# Patient Record
Sex: Female | Born: 1970 | Race: White | Hispanic: No | Smoking: Never smoker
Health system: Southern US, Community
[De-identification: ages and names within clinical notes are randomized; demographics above are authoritative.]

## PROBLEM LIST (undated history)

## (undated) DIAGNOSIS — R112 Nausea with vomiting, unspecified: Secondary | ICD-10-CM

## (undated) DIAGNOSIS — F32A Depression, unspecified: Secondary | ICD-10-CM

## (undated) DIAGNOSIS — Z9889 Other specified postprocedural states: Secondary | ICD-10-CM

## (undated) DIAGNOSIS — D649 Anemia, unspecified: Secondary | ICD-10-CM

## (undated) DIAGNOSIS — F419 Anxiety disorder, unspecified: Secondary | ICD-10-CM

## (undated) DIAGNOSIS — F329 Major depressive disorder, single episode, unspecified: Secondary | ICD-10-CM

## (undated) DIAGNOSIS — C9210 Chronic myeloid leukemia, BCR/ABL-positive, not having achieved remission: Secondary | ICD-10-CM

## (undated) HISTORY — PX: BREAST SURGERY: SHX581

## (undated) HISTORY — PX: DILATION AND CURETTAGE OF UTERUS: SHX78

## (undated) HISTORY — PX: CHOLECYSTECTOMY: SHX55

---

## 2000-05-15 ENCOUNTER — Encounter (INDEPENDENT_AMBULATORY_CARE_PROVIDER_SITE_OTHER): Payer: Self-pay | Admitting: Specialist

## 2000-05-15 ENCOUNTER — Other Ambulatory Visit: Admission: RE | Admit: 2000-05-15 | Discharge: 2000-05-15 | Payer: Self-pay | Admitting: Plastic Surgery

## 2007-08-15 ENCOUNTER — Ambulatory Visit: Payer: Self-pay | Admitting: Hematology & Oncology

## 2007-08-19 LAB — CHCC SMEAR

## 2007-08-19 LAB — CBC & DIFF AND RETIC
BASO%: 0.4 % (ref 0.0–2.0)
IRF: 0.35 — ABNORMAL HIGH (ref 0.130–0.330)
MCHC: 34.4 g/dL (ref 32.0–36.0)
MONO#: 0.5 10*3/uL (ref 0.1–0.9)
RBC: 4.16 10*6/uL (ref 3.70–5.32)
Retic %: 1.3 % (ref 0.4–2.3)
WBC: 43.2 10*3/uL — ABNORMAL HIGH (ref 3.9–10.0)
lymph#: 3.9 10*3/uL — ABNORMAL HIGH (ref 0.9–3.3)

## 2007-08-19 LAB — IVY BLEEDING TIME: Bleeding Time: 12 Minutes — ABNORMAL HIGH (ref 2.0–8.0)

## 2007-08-23 ENCOUNTER — Encounter: Payer: Self-pay | Admitting: Hematology & Oncology

## 2007-08-23 ENCOUNTER — Ambulatory Visit (HOSPITAL_COMMUNITY): Admission: RE | Admit: 2007-08-23 | Discharge: 2007-08-23 | Payer: Self-pay | Admitting: Hematology & Oncology

## 2007-09-02 LAB — VON WILLEBRAND PANEL: Ristocetin-Cofactor: 51 % (ref 50–150)

## 2007-09-02 LAB — TRANSFERRIN RECEPTOR, SOLUABLE: Transferrin Receptor, Soluble: 17.1 nmol/L

## 2007-09-03 LAB — TECHNOLOGIST REVIEW

## 2007-09-03 LAB — CBC WITH DIFFERENTIAL/PLATELET
BASO%: 0.3 % (ref 0.0–2.0)
Basophils Absolute: 0.1 10e3/uL (ref 0.0–0.1)
EOS%: 0.7 % (ref 0.0–7.0)
Eosinophils Absolute: 0.3 10e3/uL (ref 0.0–0.5)
HCT: 35.4 % (ref 34.8–46.6)
HGB: 12.2 g/dL (ref 11.6–15.9)
LYMPH%: 11.7 % — ABNORMAL LOW (ref 14.0–48.0)
MCH: 30.7 pg (ref 26.0–34.0)
MCHC: 34.5 g/dL (ref 32.0–36.0)
MCV: 88.9 fL (ref 81.0–101.0)
MONO#: 0.1 10e3/uL (ref 0.1–0.9)
MONO%: 0.3 % (ref 0.0–13.0)
NEUT#: 37.8 10e3/uL — ABNORMAL HIGH (ref 1.5–6.5)
NEUT%: 87 % — ABNORMAL HIGH (ref 39.6–76.8)
Platelets: 1130 10e3/uL — ABNORMAL HIGH (ref 145–400)
RBC: 3.98 10e6/uL (ref 3.70–5.32)
RDW: 16.7 % — ABNORMAL HIGH (ref 11.3–14.5)
WBC: 43.4 10e3/uL — ABNORMAL HIGH (ref 3.9–10.0)
lymph#: 5.1 10e3/uL — ABNORMAL HIGH (ref 0.9–3.3)

## 2007-09-03 LAB — CHCC SMEAR

## 2007-09-09 LAB — CBC WITH DIFFERENTIAL/PLATELET
Basophils Absolute: 0.2 10*3/uL — ABNORMAL HIGH (ref 0.0–0.1)
Eosinophils Absolute: 0 10*3/uL (ref 0.0–0.5)
HGB: 12.1 g/dL (ref 11.6–15.9)
NEUT#: 8.9 10*3/uL — ABNORMAL HIGH (ref 1.5–6.5)
RDW: 17 % — ABNORMAL HIGH (ref 11.3–14.5)
WBC: 11.3 10*3/uL — ABNORMAL HIGH (ref 3.9–10.0)
lymph#: 2.1 10*3/uL (ref 0.9–3.3)

## 2007-09-09 LAB — MORPHOLOGY

## 2007-09-16 LAB — CBC WITH DIFFERENTIAL/PLATELET
BASO%: 1.2 % (ref 0.0–2.0)
EOS%: 0.6 % (ref 0.0–7.0)
LYMPH%: 25.7 % (ref 14.0–48.0)
MCH: 30.6 pg (ref 26.0–34.0)
MCHC: 34.2 g/dL (ref 32.0–36.0)
MONO#: 0.2 10*3/uL (ref 0.1–0.9)
Platelets: 363 10*3/uL (ref 145–400)
RBC: 3.71 10*6/uL (ref 3.70–5.32)
WBC: 5.2 10*3/uL (ref 3.9–10.0)
lymph#: 1.3 10*3/uL (ref 0.9–3.3)

## 2007-09-16 LAB — MORPHOLOGY: PLT EST: ADEQUATE

## 2007-09-23 LAB — CBC WITH DIFFERENTIAL/PLATELET
Basophils Absolute: 0 10*3/uL (ref 0.0–0.1)
EOS%: 1.1 % (ref 0.0–7.0)
HCT: 30.8 % — ABNORMAL LOW (ref 34.8–46.6)
HGB: 10.7 g/dL — ABNORMAL LOW (ref 11.6–15.9)
MCH: 31.4 pg (ref 26.0–34.0)
NEUT%: 50.2 % (ref 39.6–76.8)
lymph#: 1.2 10*3/uL (ref 0.9–3.3)

## 2007-09-23 LAB — MORPHOLOGY: PLT EST: ADEQUATE

## 2007-09-25 ENCOUNTER — Ambulatory Visit: Payer: Self-pay | Admitting: Hematology & Oncology

## 2007-10-01 LAB — CBC WITH DIFFERENTIAL/PLATELET
Basophils Absolute: 0 10*3/uL (ref 0.0–0.1)
Eosinophils Absolute: 0 10*3/uL (ref 0.0–0.5)
HGB: 12.1 g/dL (ref 11.6–15.9)
MCV: 91.7 fL (ref 81.0–101.0)
MONO#: 0.1 10*3/uL (ref 0.1–0.9)
NEUT#: 1.4 10*3/uL — ABNORMAL LOW (ref 1.5–6.5)
RDW: 13.5 % (ref 11.3–14.5)
lymph#: 1.3 10*3/uL (ref 0.9–3.3)

## 2007-10-01 LAB — MORPHOLOGY: PLT EST: ADEQUATE

## 2007-10-09 LAB — CBC WITH DIFFERENTIAL/PLATELET
BASO%: 0.7 % (ref 0.0–2.0)
EOS%: 0.5 % (ref 0.0–7.0)
LYMPH%: 46.4 % (ref 14.0–48.0)
MCHC: 35.3 g/dL (ref 32.0–36.0)
MONO#: 0.2 10*3/uL (ref 0.1–0.9)
RBC: 4.02 10*6/uL (ref 3.70–5.32)
WBC: 3 10*3/uL — ABNORMAL LOW (ref 3.9–10.0)
lymph#: 1.4 10*3/uL (ref 0.9–3.3)

## 2007-10-29 LAB — CBC WITH DIFFERENTIAL/PLATELET
Eosinophils Absolute: 0 10*3/uL (ref 0.0–0.5)
MCV: 89.5 fL (ref 81.0–101.0)
MONO#: 0.2 10*3/uL (ref 0.1–0.9)
MONO%: 4.1 % (ref 0.0–13.0)
NEUT#: 3 10*3/uL (ref 1.5–6.5)
RBC: 3.92 10*6/uL (ref 3.70–5.32)
RDW: 15.3 % — ABNORMAL HIGH (ref 11.3–14.5)
WBC: 4.8 10*3/uL (ref 3.9–10.0)

## 2007-11-14 ENCOUNTER — Ambulatory Visit: Payer: Self-pay | Admitting: Hematology & Oncology

## 2007-11-18 LAB — CBC WITH DIFFERENTIAL/PLATELET
Eosinophils Absolute: 0 10*3/uL (ref 0.0–0.5)
LYMPH%: 38.7 % (ref 14.0–48.0)
MONO#: 0.2 10*3/uL (ref 0.1–0.9)
NEUT#: 3.3 10*3/uL (ref 1.5–6.5)
Platelets: 392 10*3/uL (ref 145–400)
RBC: 4.33 10*6/uL (ref 3.70–5.32)
RDW: 14.2 % (ref 11.3–14.5)
WBC: 5.8 10*3/uL (ref 3.9–10.0)
lymph#: 2.2 10*3/uL (ref 0.9–3.3)

## 2007-11-18 LAB — COMPREHENSIVE METABOLIC PANEL
Albumin: 4.7 g/dL (ref 3.5–5.2)
BUN: 14 mg/dL (ref 6–23)
CO2: 23 mEq/L (ref 19–32)
Calcium: 9.7 mg/dL (ref 8.4–10.5)
Chloride: 102 mEq/L (ref 96–112)
Glucose, Bld: 95 mg/dL (ref 70–99)
Potassium: 4.2 mEq/L (ref 3.5–5.3)

## 2007-11-18 LAB — CHCC SMEAR

## 2007-11-18 LAB — LACTATE DEHYDROGENASE: LDH: 162 U/L (ref 94–250)

## 2007-12-25 ENCOUNTER — Ambulatory Visit: Payer: Self-pay | Admitting: Hematology & Oncology

## 2007-12-27 LAB — CBC WITH DIFFERENTIAL/PLATELET
Basophils Absolute: 0 10*3/uL (ref 0.0–0.1)
Eosinophils Absolute: 0 10*3/uL (ref 0.0–0.5)
HCT: 39.3 % (ref 34.8–46.6)
HGB: 13.9 g/dL (ref 11.6–15.9)
LYMPH%: 22.8 % (ref 14.0–48.0)
MCV: 89.8 fL (ref 81.0–101.0)
MONO#: 0.4 10*3/uL (ref 0.1–0.9)
MONO%: 5.4 % (ref 0.0–13.0)
NEUT#: 5.2 10*3/uL (ref 1.5–6.5)
Platelets: 589 10*3/uL — ABNORMAL HIGH (ref 145–400)
WBC: 7.3 10*3/uL (ref 3.9–10.0)

## 2007-12-27 LAB — CHCC SMEAR

## 2008-02-20 ENCOUNTER — Ambulatory Visit: Payer: Self-pay | Admitting: Hematology & Oncology

## 2008-02-24 LAB — CBC WITH DIFFERENTIAL/PLATELET
Basophils Absolute: 0.1 10*3/uL (ref 0.0–0.1)
Eosinophils Absolute: 0 10*3/uL (ref 0.0–0.5)
HGB: 13.3 g/dL (ref 11.6–15.9)
MCV: 90.5 fL (ref 81.0–101.0)
MONO#: 0.3 10*3/uL (ref 0.1–0.9)
NEUT#: 5.3 10*3/uL (ref 1.5–6.5)
RDW: 13.9 % (ref 11.3–14.5)
lymph#: 2.5 10*3/uL (ref 0.9–3.3)

## 2008-03-02 ENCOUNTER — Ambulatory Visit (HOSPITAL_COMMUNITY): Admission: RE | Admit: 2008-03-02 | Discharge: 2008-03-02 | Payer: Self-pay | Admitting: Hematology & Oncology

## 2008-03-02 ENCOUNTER — Encounter: Payer: Self-pay | Admitting: Hematology & Oncology

## 2008-03-12 LAB — BCR/ABL

## 2008-03-13 LAB — CBC WITH DIFFERENTIAL/PLATELET
BASO%: 1.9 % (ref 0.0–2.0)
EOS%: 0.8 % (ref 0.0–7.0)
HCT: 40.3 % (ref 34.8–46.6)
LYMPH%: 32.4 % (ref 14.0–48.0)
MCH: 30.7 pg (ref 26.0–34.0)
MCHC: 34.2 g/dL (ref 32.0–36.0)
MCV: 89.8 fL (ref 81.0–101.0)
MONO%: 3.1 % (ref 0.0–13.0)
NEUT%: 61.8 % (ref 39.6–76.8)
Platelets: 676 10*3/uL — ABNORMAL HIGH (ref 145–400)
lymph#: 2.3 10*3/uL (ref 0.9–3.3)

## 2008-03-25 ENCOUNTER — Ambulatory Visit: Payer: Self-pay | Admitting: Hematology & Oncology

## 2008-03-30 LAB — MORPHOLOGY - CHCC SATELLITE: RBC Comments: NORMAL

## 2008-04-08 LAB — CBC WITH DIFFERENTIAL (CANCER CENTER ONLY)
BASO#: 0 10*3/uL (ref 0.0–0.2)
EOS%: 1.4 % (ref 0.0–7.0)
HGB: 13.9 g/dL (ref 11.6–15.9)
MCH: 30.8 pg (ref 26.0–34.0)
MCHC: 33.4 g/dL (ref 32.0–36.0)
MONO%: 4.8 % (ref 0.0–13.0)
NEUT#: 2.2 10*3/uL (ref 1.5–6.5)

## 2008-04-08 LAB — MORPHOLOGY - CHCC SATELLITE

## 2008-04-13 LAB — CBC WITH DIFFERENTIAL (CANCER CENTER ONLY)
BASO#: 0 10*3/uL (ref 0.0–0.2)
EOS%: 2.1 % (ref 0.0–7.0)
HCT: 40 % (ref 34.8–46.6)
HGB: 13.4 g/dL (ref 11.6–15.9)
LYMPH#: 1.8 10*3/uL (ref 0.9–3.3)
LYMPH%: 40.4 % (ref 14.0–48.0)
MCH: 31.1 pg (ref 26.0–34.0)
MCHC: 33.6 g/dL (ref 32.0–36.0)
MCV: 92 fL (ref 81–101)
MONO%: 4.8 % (ref 0.0–13.0)
NEUT%: 52.2 % (ref 39.6–80.0)

## 2008-04-13 LAB — MORPHOLOGY - CHCC SATELLITE: PLT EST ~~LOC~~: ADEQUATE

## 2008-05-11 ENCOUNTER — Ambulatory Visit: Payer: Self-pay | Admitting: Hematology & Oncology

## 2008-05-11 LAB — CBC WITH DIFFERENTIAL (CANCER CENTER ONLY)
BASO%: 0.5 % (ref 0.0–2.0)
EOS%: 1 % (ref 0.0–7.0)
Eosinophils Absolute: 0 10*3/uL (ref 0.0–0.5)
MCH: 31.3 pg (ref 26.0–34.0)
MCHC: 33.9 g/dL (ref 32.0–36.0)
MONO%: 6.1 % (ref 0.0–13.0)
NEUT#: 1.6 10*3/uL (ref 1.5–6.5)
Platelets: 229 10*3/uL (ref 145–400)
RBC: 4.11 10*6/uL (ref 3.70–5.32)

## 2008-05-11 LAB — CHCC SATELLITE - SMEAR

## 2008-06-15 LAB — CBC WITH DIFFERENTIAL (CANCER CENTER ONLY)
BASO#: 0 10*3/uL (ref 0.0–0.2)
EOS%: 1.3 % (ref 0.0–7.0)
Eosinophils Absolute: 0.1 10*3/uL (ref 0.0–0.5)
HGB: 13.3 g/dL (ref 11.6–15.9)
LYMPH#: 1.8 10*3/uL (ref 0.9–3.3)
NEUT#: 1.5 10*3/uL (ref 1.5–6.5)
Platelets: 202 10*3/uL (ref 145–400)
RBC: 4.22 10*6/uL (ref 3.70–5.32)
WBC: 3.5 10*3/uL — ABNORMAL LOW (ref 3.9–10.0)

## 2008-06-15 LAB — BASIC METABOLIC PANEL
Calcium: 9.2 mg/dL (ref 8.4–10.5)
Chloride: 108 mEq/L (ref 96–112)
Creatinine, Ser: 0.69 mg/dL (ref 0.40–1.20)

## 2008-06-15 LAB — MAGNESIUM: Magnesium: 2.4 mg/dL (ref 1.5–2.5)

## 2008-06-23 LAB — BCR/ABL (CHCC SATELLITE)

## 2008-08-28 ENCOUNTER — Ambulatory Visit: Payer: Self-pay | Admitting: Hematology & Oncology

## 2008-08-31 LAB — CBC WITH DIFFERENTIAL (CANCER CENTER ONLY)
BASO%: 0.4 % (ref 0.0–2.0)
EOS%: 1.5 % (ref 0.0–7.0)
HGB: 12.5 g/dL (ref 11.6–15.9)
LYMPH#: 1.8 10*3/uL (ref 0.9–3.3)
MCHC: 34.3 g/dL (ref 32.0–36.0)
MONO%: 7.1 % (ref 0.0–13.0)
NEUT#: 1.4 10*3/uL — ABNORMAL LOW (ref 1.5–6.5)
Platelets: 229 10*3/uL (ref 145–400)
RDW: 11.8 % (ref 10.5–14.6)

## 2008-09-25 DIAGNOSIS — C9210 Chronic myeloid leukemia, BCR/ABL-positive, not having achieved remission: Secondary | ICD-10-CM

## 2008-09-25 HISTORY — DX: Chronic myeloid leukemia, BCR/ABL-positive, not having achieved remission: C92.10

## 2008-11-27 ENCOUNTER — Ambulatory Visit: Payer: Self-pay | Admitting: Hematology & Oncology

## 2008-11-30 LAB — CBC WITH DIFFERENTIAL (CANCER CENTER ONLY)
BASO%: 0.5 % (ref 0.0–2.0)
EOS%: 0.9 % (ref 0.0–7.0)
HCT: 37.7 % (ref 34.8–46.6)
LYMPH#: 1.9 10*3/uL (ref 0.9–3.3)
LYMPH%: 51.7 % — ABNORMAL HIGH (ref 14.0–48.0)
MCH: 31.1 pg (ref 26.0–34.0)
MCHC: 33.3 g/dL (ref 32.0–36.0)
MONO%: 7.3 % (ref 0.0–13.0)
NEUT%: 39.6 % (ref 39.6–80.0)
RDW: 11.1 % (ref 10.5–14.6)

## 2009-02-26 ENCOUNTER — Ambulatory Visit: Payer: Self-pay | Admitting: Hematology & Oncology

## 2009-03-01 LAB — CBC WITH DIFFERENTIAL (CANCER CENTER ONLY)
BASO%: 0.5 % (ref 0.0–2.0)
HCT: 39.6 % (ref 34.8–46.6)
LYMPH%: 55.1 % — ABNORMAL HIGH (ref 14.0–48.0)
MCHC: 33.7 g/dL (ref 32.0–36.0)
MCV: 93 fL (ref 81–101)
MONO#: 0.4 10*3/uL (ref 0.1–0.9)
NEUT%: 23.4 % — ABNORMAL LOW (ref 39.6–80.0)
RDW: 12.4 % (ref 10.5–14.6)
WBC: 4.7 10*3/uL (ref 3.9–10.0)

## 2009-03-01 LAB — CHCC SATELLITE - SMEAR

## 2009-03-17 LAB — BCR/ABL (CHCC SATELLITE)

## 2009-04-21 IMAGING — US US ABDOMEN LIMITED
1 series · 12 of 12 positions shown · non-contrast
Comparison: None.

CLINICAL DATA: 36-year-old, new diagnosis CML.  Evaluate for splenomegaly. 
 ULTRASOUND ABDOMEN LIMITED:
TECHNIQUE: Limited abdominal ultrasound examination was performed.

[Series 1: unknown · 0.33mm/px · 12 of 12 slices shown]
[im 1/12]
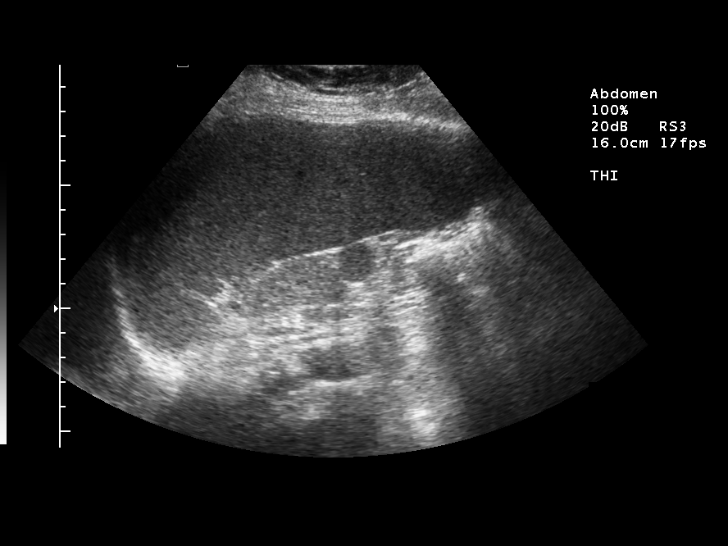
[im 2/12]
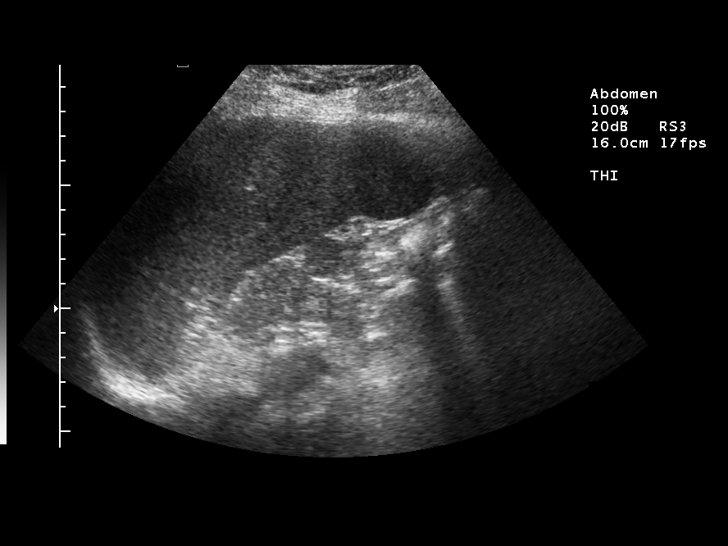
[im 3/12]
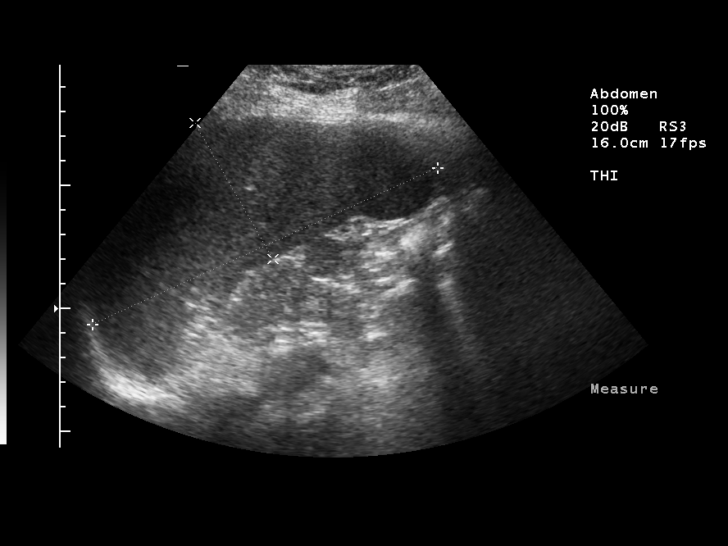
[im 4/12]
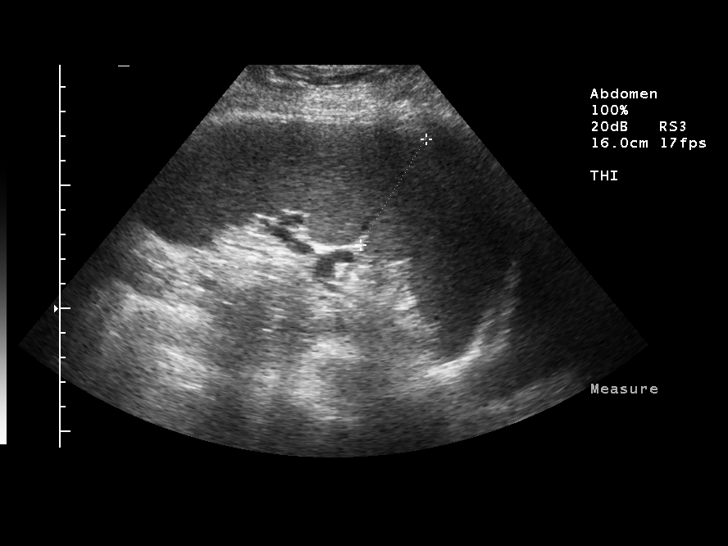
[im 5/12]
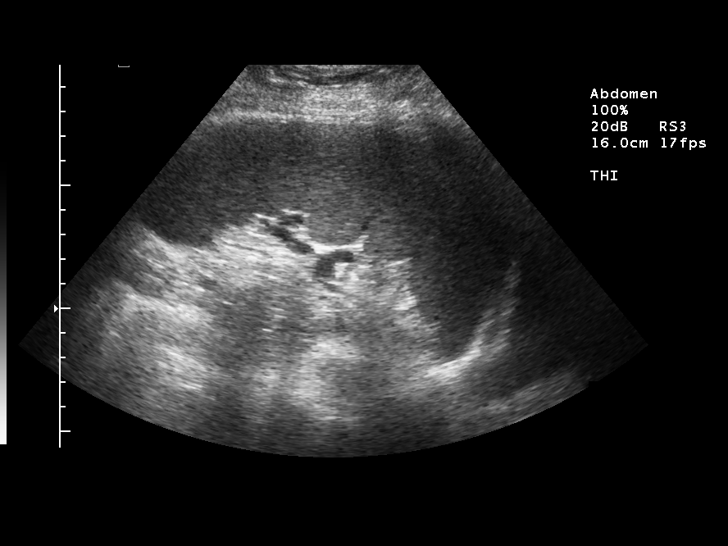
[im 6/12]
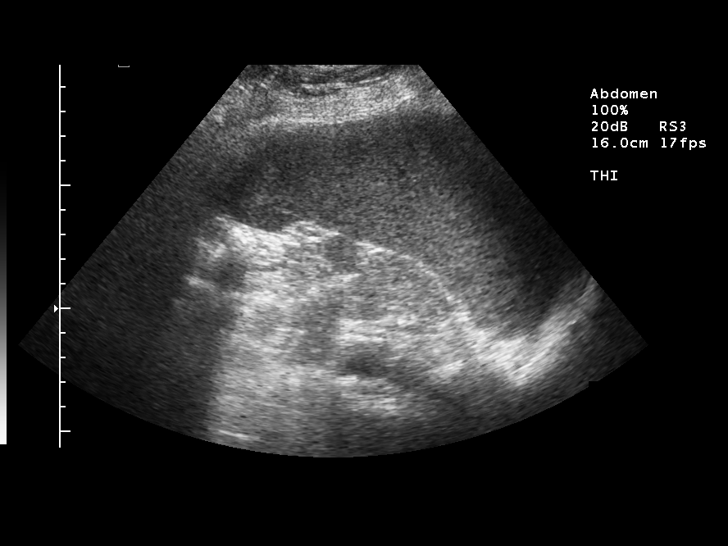
[im 7/12]
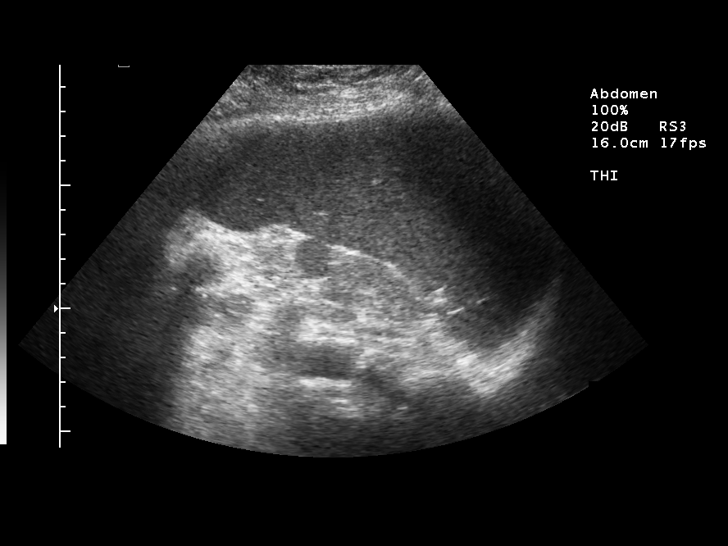
[im 8/12]
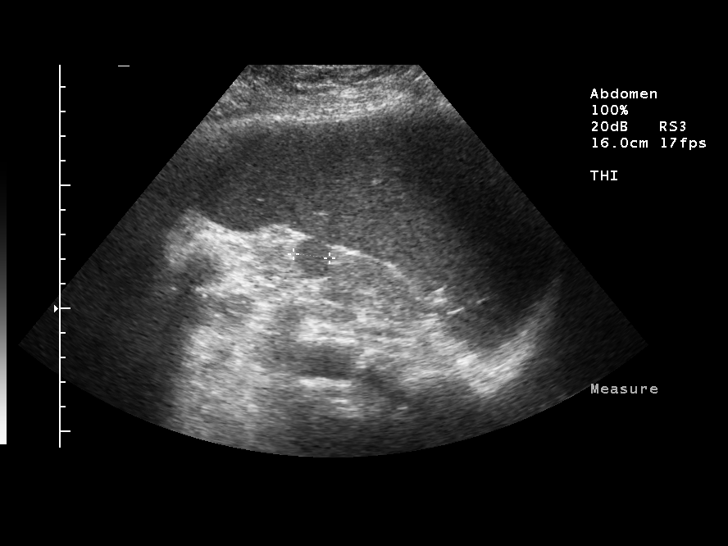
[im 9/12]
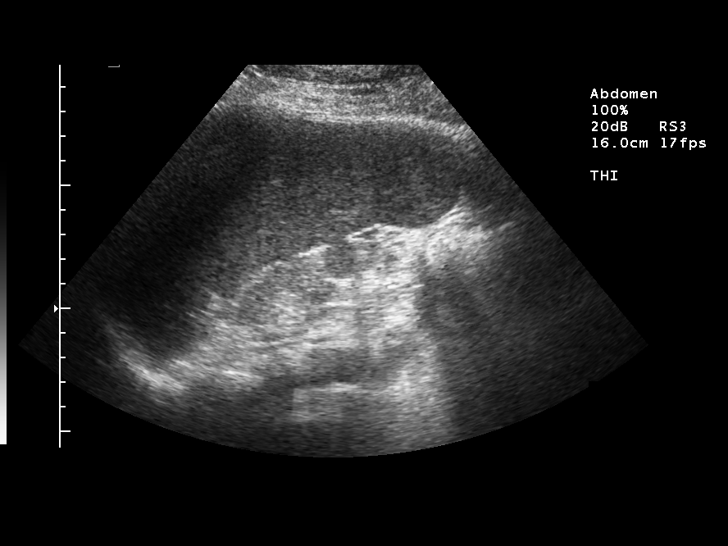
[im 10/12]
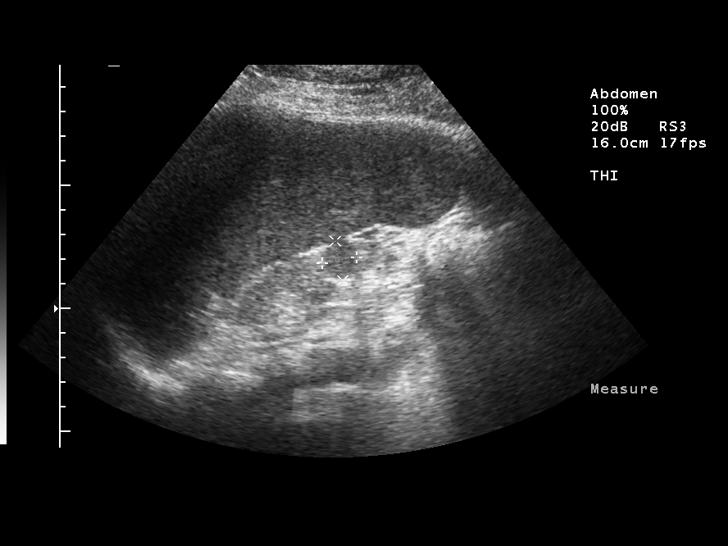
[im 11/12]
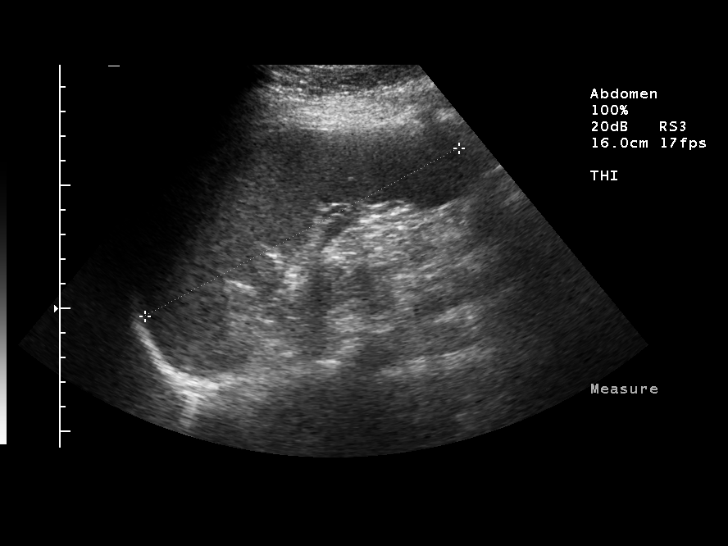
[im 12/12]
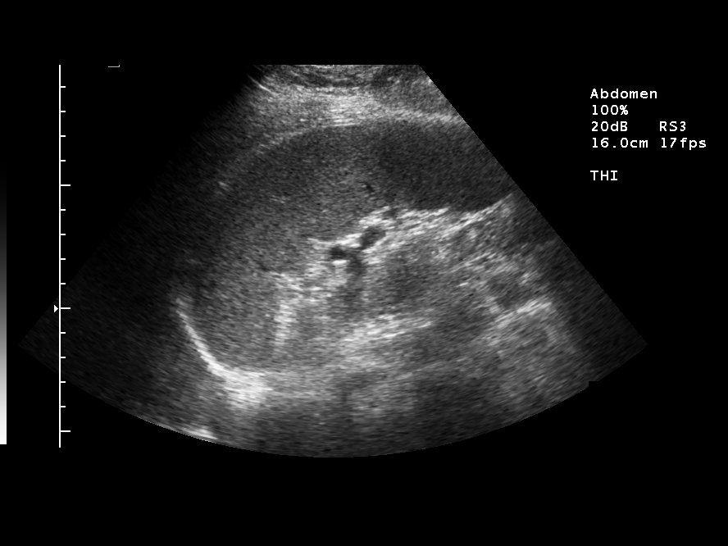

[12 of 12 positions shown; findings below may reference images not displayed]

FINDINGS: The spleen measures a maximum of 16.7 x 6.2 x 9.4 cm.   This gives a splenic volume of 509 cubic centimeters which is abnormal.   Normal would be below 315 cubic centimeters.   There is also an accessory spleen in the splenic hilum.
IMPRESSION: Splenomegaly with splenic volume of 509 cubic centimeters.

## 2009-06-25 ENCOUNTER — Ambulatory Visit: Payer: Self-pay | Admitting: Hematology & Oncology

## 2009-06-28 LAB — CBC WITH DIFFERENTIAL (CANCER CENTER ONLY)
Eosinophils Absolute: 0 10*3/uL (ref 0.0–0.5)
HCT: 37.6 % (ref 34.8–46.6)
LYMPH%: 64 % — ABNORMAL HIGH (ref 14.0–48.0)
MCV: 93 fL (ref 81–101)
MONO#: 0.2 10*3/uL (ref 0.1–0.9)
NEUT%: 29.6 % — ABNORMAL LOW (ref 39.6–80.0)
RBC: 4.03 10*6/uL (ref 3.70–5.32)
WBC: 3.5 10*3/uL — ABNORMAL LOW (ref 3.9–10.0)

## 2009-06-28 LAB — COMPREHENSIVE METABOLIC PANEL
ALT: 29 U/L (ref 0–35)
CO2: 24 mEq/L (ref 19–32)
Calcium: 9.1 mg/dL (ref 8.4–10.5)
Chloride: 103 mEq/L (ref 96–112)
Creatinine, Ser: 0.66 mg/dL (ref 0.40–1.20)

## 2009-06-28 LAB — MAGNESIUM: Magnesium: 1.9 mg/dL (ref 1.5–2.5)

## 2009-07-05 LAB — BCR/ABL (CHCC SATELLITE)

## 2009-10-01 ENCOUNTER — Ambulatory Visit: Payer: Self-pay | Admitting: Hematology & Oncology

## 2009-11-19 ENCOUNTER — Ambulatory Visit: Payer: Self-pay | Admitting: Hematology & Oncology

## 2009-11-22 LAB — CBC WITH DIFFERENTIAL (CANCER CENTER ONLY)
BASO#: 0 10*3/uL (ref 0.0–0.2)
BASO%: 0.8 % (ref 0.0–2.0)
EOS%: 0.9 % (ref 0.0–7.0)
Eosinophils Absolute: 0.1 10*3/uL (ref 0.0–0.5)
HCT: 39.9 % (ref 34.8–46.6)
HGB: 13.5 g/dL (ref 11.6–15.9)
LYMPH#: 2.4 10*3/uL (ref 0.9–3.3)
LYMPH%: 46.2 % (ref 14.0–48.0)
MCH: 31.6 pg (ref 26.0–34.0)
MCHC: 33.8 g/dL (ref 32.0–36.0)
MCV: 93 fL (ref 81–101)
MONO#: 0.3 10*3/uL (ref 0.1–0.9)
MONO%: 4.8 % (ref 0.0–13.0)
NEUT#: 2.4 10*3/uL (ref 1.5–6.5)
NEUT%: 47.3 % (ref 39.6–80.0)
Platelets: 290 10*3/uL (ref 145–400)
RBC: 4.28 10*6/uL (ref 3.70–5.32)
RDW: 10.9 % (ref 10.5–14.6)
WBC: 5.1 10*3/uL (ref 3.9–10.0)

## 2009-11-22 LAB — CHCC SATELLITE - SMEAR

## 2009-11-22 LAB — COMPREHENSIVE METABOLIC PANEL
ALT: 23 U/L (ref 0–35)
BUN: 14 mg/dL (ref 6–23)
CO2: 22 mEq/L (ref 19–32)
Calcium: 9.3 mg/dL (ref 8.4–10.5)
Creatinine, Ser: 0.59 mg/dL (ref 0.40–1.20)
Total Bilirubin: 0.6 mg/dL (ref 0.3–1.2)

## 2009-11-29 LAB — BCR/ABL (CHCC SATELLITE)

## 2010-02-11 ENCOUNTER — Ambulatory Visit: Payer: Self-pay | Admitting: Hematology & Oncology

## 2010-05-06 ENCOUNTER — Ambulatory Visit: Payer: Self-pay | Admitting: Hematology & Oncology

## 2010-06-06 ENCOUNTER — Ambulatory Visit: Payer: Self-pay | Admitting: Hematology & Oncology

## 2010-06-06 LAB — CBC WITH DIFFERENTIAL (CANCER CENTER ONLY)
BASO#: 0 10*3/uL (ref 0.0–0.2)
BASO%: 0.2 % (ref 0.0–2.0)
EOS%: 1 % (ref 0.0–7.0)
Eosinophils Absolute: 0 10*3/uL (ref 0.0–0.5)
HCT: 37.3 % (ref 34.8–46.6)
HGB: 12.5 g/dL (ref 11.6–15.9)
LYMPH#: 1.4 10*3/uL (ref 0.9–3.3)
LYMPH%: 39.1 % (ref 14.0–48.0)
MCH: 30.5 pg (ref 26.0–34.0)
MCHC: 33.5 g/dL (ref 32.0–36.0)
MCV: 91 fL (ref 81–101)
MONO#: 0.3 10*3/uL (ref 0.1–0.9)
MONO%: 9.8 % (ref 0.0–13.0)
NEUT#: 1.7 10*3/uL (ref 1.5–6.5)
NEUT%: 49.9 % (ref 39.6–80.0)
Platelets: 260 10*3/uL (ref 145–400)
RBC: 4.08 10*6/uL (ref 3.70–5.32)
RDW: 11.5 % (ref 10.5–14.6)
WBC: 3.5 10*3/uL — ABNORMAL LOW (ref 3.9–10.0)

## 2010-06-06 LAB — CHCC SATELLITE - SMEAR

## 2010-06-16 LAB — BCR/ABL (CHCC SATELLITE)

## 2010-09-27 ENCOUNTER — Ambulatory Visit: Payer: Self-pay | Admitting: Hematology & Oncology

## 2010-09-29 LAB — COMPREHENSIVE METABOLIC PANEL
ALT: 22 U/L (ref 0–35)
AST: 25 U/L (ref 0–37)
Albumin: 4.2 g/dL (ref 3.5–5.2)
Alkaline Phosphatase: 45 U/L (ref 39–117)
BUN: 18 mg/dL (ref 6–23)
CO2: 23 mEq/L (ref 19–32)
Calcium: 8.8 mg/dL (ref 8.4–10.5)
Chloride: 105 mEq/L (ref 96–112)
Creatinine, Ser: 0.64 mg/dL (ref 0.40–1.20)
Glucose, Bld: 90 mg/dL (ref 70–99)
Potassium: 3.8 mEq/L (ref 3.5–5.3)
Sodium: 137 mEq/L (ref 135–145)
Total Bilirubin: 0.5 mg/dL (ref 0.3–1.2)
Total Protein: 7.6 g/dL (ref 6.0–8.3)

## 2010-09-29 LAB — CBC WITH DIFFERENTIAL (CANCER CENTER ONLY)
BASO#: 0 10*3/uL (ref 0.0–0.2)
BASO%: 0.3 % (ref 0.0–2.0)
EOS%: 0.9 % (ref 0.0–7.0)
Eosinophils Absolute: 0 10*3/uL (ref 0.0–0.5)
HCT: 35.3 % (ref 34.8–46.6)
HGB: 11.9 g/dL (ref 11.6–15.9)
LYMPH#: 1.4 10*3/uL (ref 0.9–3.3)
LYMPH%: 47.7 % (ref 14.0–48.0)
MCH: 30.2 pg (ref 26.0–34.0)
MCHC: 33.8 g/dL (ref 32.0–36.0)
MCV: 90 fL (ref 81–101)
MONO#: 0.2 10*3/uL (ref 0.1–0.9)
MONO%: 8.3 % (ref 0.0–13.0)
NEUT#: 1.2 10*3/uL — ABNORMAL LOW (ref 1.5–6.5)
NEUT%: 42.8 % (ref 39.6–80.0)
Platelets: 224 10*3/uL (ref 145–400)
RBC: 3.94 10*6/uL (ref 3.70–5.32)
RDW: 13.4 % (ref 10.5–14.6)
WBC: 2.9 10*3/uL — ABNORMAL LOW (ref 3.9–10.0)

## 2010-09-29 LAB — RETICULOCYTES (CHCC)
ABS Retic: 78 10*3/uL (ref 19.0–186.0)
RBC.: 3.9 MIL/uL (ref 3.87–5.11)
Retic Ct Pct: 2 % (ref 0.4–3.1)

## 2010-09-29 LAB — CHCC SATELLITE - SMEAR

## 2010-10-06 LAB — BCR/ABL (CHCC SATELLITE)

## 2011-01-12 ENCOUNTER — Other Ambulatory Visit: Payer: Self-pay | Admitting: Hematology & Oncology

## 2011-01-12 ENCOUNTER — Encounter (HOSPITAL_BASED_OUTPATIENT_CLINIC_OR_DEPARTMENT_OTHER): Payer: BLUE CROSS/BLUE SHIELD | Admitting: Hematology & Oncology

## 2011-01-12 DIAGNOSIS — C9210 Chronic myeloid leukemia, BCR/ABL-positive, not having achieved remission: Secondary | ICD-10-CM

## 2011-01-12 LAB — CBC WITH DIFFERENTIAL (CANCER CENTER ONLY)
BASO#: 0 10*3/uL (ref 0.0–0.2)
EOS%: 0.6 % (ref 0.0–7.0)
Eosinophils Absolute: 0 10*3/uL (ref 0.0–0.5)
HCT: 35.4 % (ref 34.8–46.6)
HGB: 12 g/dL (ref 11.6–15.9)
LYMPH#: 2.1 10*3/uL (ref 0.9–3.3)
MCHC: 33.9 g/dL (ref 32.0–36.0)
MONO#: 0.3 10*3/uL (ref 0.1–0.9)
NEUT#: 1.2 10*3/uL — ABNORMAL LOW (ref 1.5–6.5)
NEUT%: 33 % — ABNORMAL LOW (ref 39.6–80.0)
RBC: 4.01 10*6/uL (ref 3.70–5.32)

## 2011-01-12 LAB — LACTATE DEHYDROGENASE: LDH: 142 U/L (ref 94–250)

## 2011-01-12 LAB — COMPREHENSIVE METABOLIC PANEL
Albumin: 4.6 g/dL (ref 3.5–5.2)
BUN: 14 mg/dL (ref 6–23)
CO2: 23 mEq/L (ref 19–32)
Calcium: 9.6 mg/dL (ref 8.4–10.5)
Chloride: 103 mEq/L (ref 96–112)
Creatinine, Ser: 0.64 mg/dL (ref 0.40–1.20)
Glucose, Bld: 89 mg/dL (ref 70–99)
Potassium: 4.1 mEq/L (ref 3.5–5.3)

## 2011-01-12 LAB — CHCC SATELLITE - SMEAR

## 2011-01-19 LAB — BCR/ABL (LIO MMD)

## 2011-02-07 NOTE — Op Note (Signed)
[name], [name]            ACCOUNT NO.:  [id number]   MEDICAL RECORD NO.:  [id number]          PATIENT TYPE:  OUT   LOCATION:  [facility]                         FACILITY:  [facility]   PHYSICIAN:  Rose Phi. Myna Hidalgo, M.D. DATE OF BIRTH:  [birth year]   DATE OF PROCEDURE:  03/02/2008  DATE OF DISCHARGE:                               OPERATIVE REPORT   PROCEDURE:  Left posterior iliac crest bone marrow biopsy.   DESCRIPTION OF PROCEDURE:  [name] was brought to the Short Stay  Unit.  She had an IV placed without difficulty.  She had a Mallampati  score of 1.  Her ASA score was 1.  The patient was placed onto her right  side.  She received a total of 8 mg of Versed and 50 mg of Demerol for  IV sedation.  After that, we prepped and draped the left posterior iliac  crest region in a sterile fashion.  Ten mL of 2% lidocaine was  infiltrated from the skin down to the periosteum.  We needed a 20 gauge  spinal needle to reach the periosteum.   A #11 scalpel was used to make an incision through the skin.  Two bone  marrow aspirates were obtained without difficulty.  After that, we then  obtained good bone marrow biopsy core without problem.  I also obtained  an additional aspirate through the core needle.   [name] tolerated the procedure well.  There were no complications.      Rose Phi. Myna Hidalgo, M.D.  Electronically Signed     PRE/MEDQ  D:  03/02/2008  T:  03/02/2008  Job:  [id number]

## 2011-02-07 NOTE — Op Note (Signed)
[name], Victoria            ACCOUNT NO.:  [id number]   MEDICAL RECORD NO.:  [id number]          PATIENT TYPE:  AMB   LOCATION:  [facility]                         FACILITY:  [facility]   PHYSICIAN:  Rose Phi. Myna Hidalgo, M.D. DATE OF BIRTH:  03-23-1971   DATE OF PROCEDURE:  08/23/2007  DATE OF DISCHARGE:                               OPERATIVE REPORT   NATURE OF PROCEDURE:  Left posterior iliac crest bone marrow biopsy and  aspirate.   Victoria [name] was brought to the Short Stay Unit.  She had an IV placed  into her right hand without difficulty.  She was then placed onto her  right side.   She received a total of 10 mg of Versed and 50 mg of Demerol for IV  sedation.   The left posterior iliac crest region was prepped and draped in sterile  fashion.  Then, 10 mL of 2% lidocaine was infiltrated under the skin  down to the periosteum.   A #1 scalpel was used to make an incision into the skin.  Two bone  marrow aspirates were obtained without difficulty.   A second incision was made into the skin.  An excellent bone marrow  biopsy core was obtained without difficulty.   The patient tolerated the procedure well.  There were no complications.      Rose Phi. Myna Hidalgo, M.D.  Electronically Signed     PRE/MEDQ  D:  08/23/2007  T:  08/23/2007  Job:  [id number]

## 2011-06-22 LAB — DIFFERENTIAL
Basophils Relative: 1
Eosinophils Relative: 1
Lymphocytes Relative: 41
Monocytes Relative: 3
Neutrophils Relative %: 54

## 2011-06-22 LAB — BONE MARROW EXAM

## 2011-06-22 LAB — CHROMOSOME ANALYSIS, BONE MARROW

## 2011-06-22 LAB — CBC
RBC: 4
WBC: 5.6

## 2011-06-22 LAB — TISSUE HYBRIDIZATION (BONE MARROW)-NCBH

## 2011-07-04 LAB — DIFFERENTIAL
Basophils Relative: 5 — ABNORMAL HIGH
Eosinophils Absolute: 0 — ABNORMAL LOW
Lymphocytes Relative: 9 — ABNORMAL LOW
Monocytes Relative: 3
Neutrophils Relative %: 83 — ABNORMAL HIGH

## 2011-07-04 LAB — CBC
Hemoglobin: 12.3
RBC: 3.99
RDW: 16 — ABNORMAL HIGH
WBC: 43.9 — ABNORMAL HIGH

## 2011-07-04 LAB — PATHOLOGIST SMEAR REVIEW

## 2011-07-04 LAB — TISSUE HYBRIDIZATION (BONE MARROW)-NCBH

## 2011-11-27 ENCOUNTER — Ambulatory Visit (INDEPENDENT_AMBULATORY_CARE_PROVIDER_SITE_OTHER): Payer: BC Managed Care – PPO | Admitting: Physician Assistant

## 2011-11-27 ENCOUNTER — Other Ambulatory Visit: Payer: Self-pay | Admitting: *Deleted

## 2011-11-27 DIAGNOSIS — C9210 Chronic myeloid leukemia, BCR/ABL-positive, not having achieved remission: Secondary | ICD-10-CM

## 2011-11-27 DIAGNOSIS — R059 Cough, unspecified: Secondary | ICD-10-CM

## 2011-11-27 DIAGNOSIS — J069 Acute upper respiratory infection, unspecified: Secondary | ICD-10-CM

## 2011-11-27 DIAGNOSIS — C9212 Chronic myeloid leukemia, BCR/ABL-positive, in relapse: Secondary | ICD-10-CM | POA: Insufficient documentation

## 2011-11-27 DIAGNOSIS — R05 Cough: Secondary | ICD-10-CM

## 2011-11-27 LAB — POCT CBC
Granulocyte percent: 45.2 %G (ref 37–80)
MCV: 91.2 fL (ref 80–97)
MID (cbc): 0.3 (ref 0–0.9)
MPV: 9.1 fL (ref 0–99.8)
POC MID %: 9.8 %M (ref 0–12)
Platelet Count, POC: 206 10*3/uL (ref 142–424)
RBC: 4.2 M/uL (ref 4.04–5.48)

## 2011-11-27 MED ORDER — HYDROCOD POLST-CHLORPHEN POLST 10-8 MG/5ML PO LQCR: 5.0000 mL | Freq: Two times a day (BID) | ORAL | Status: DC

## 2011-11-27 MED ORDER — DASATINIB 70 MG PO TABS: 70.0000 mg | ORAL_TABLET | Freq: Every day | ORAL | Status: DC

## 2011-11-27 MED ORDER — GUAIFENESIN ER 1200 MG PO TB12: 1.0000 | ORAL_TABLET | Freq: Two times a day (BID) | ORAL | Status: DC

## 2011-11-27 NOTE — Telephone Encounter (Signed)
Received refill request for Sprycel 70 mg from [facility]. Will route to Dr Myna Hidalgo for approval.  Also noticed that she has not been in for a while. Will send a request to the scheduler to set up an appt.

## 2011-11-27 NOTE — Progress Notes (Signed)
  Subjective:    Patient ID: Victoria [name], female    DOB: 08/18/1971, 41 y.o.   MRN: [id number]  Shortness of Breath Associated symptoms include wheezing. Pertinent negatives include no ear pain, fever, rhinorrhea, sore throat or vomiting.   Pt presents to clinic for 2d h/o cold symptoms with cough and chest tightness.  Coughing that is not productive but sounds like she has phlegm in her throat, no PND known.  Her congestion just started this am.  No fever or chills.  Is sleep deprived because of 56 mo old child, but no exposures to illnesses that she knowns of.  She has no h/o asthma, no fm h/o asthma and is not a smoker.  She has heard herself wheezing at night some and is having a hard time getting a deep breath.  She has used no OTC meds.  She is currently being treated for CML and her counts run a little low, last one per pt about 1 month ago, she sees Dr. Myna Hidalgo.   Review of Systems  Constitutional: Negative for fever and chills.  HENT: Positive for congestion. Negative for ear pain, sore throat, rhinorrhea and postnasal drip.   Respiratory: Positive for cough, chest tightness (with deep breaths), shortness of breath and wheezing.   Gastrointestinal: Negative for nausea, vomiting and diarrhea.       Objective:   Physical Exam  Constitutional: She is oriented to person, place, and time. She appears well-developed and well-nourished.  HENT:  Head: Normocephalic and atraumatic.  Right Ear: Hearing, tympanic membrane, external ear and ear canal normal.  Left Ear: Hearing, tympanic membrane, external ear and ear canal normal.  Nose: Nose normal.  Mouth/Throat: Uvula is midline.  Eyes: Conjunctivae are normal.  Cardiovascular: Normal rate, regular rhythm and normal heart sounds.   No murmur heard. Pulmonary/Chest: Effort normal and breath sounds normal. She has no wheezes.  Lymphadenopathy:    She has no cervical adenopathy.  Neurological: She is alert and oriented to person,  place, and time.  Skin: Skin is warm and dry.  Psychiatric: She has a normal mood and affect. Her behavior is normal. Thought content normal.          Assessment & Plan:   1. CML (chronic myelocytic leukemia)  POCT CBC  2. Acute upper respiratory infections of unspecified site  POCT CBC, Guaifenesin (MUCINEX MAXIMUM STRENGTH) 1200 MG TB12, chlorpheniramine-HYDROcodone (TUSSIONEX PENNKINETIC ER) 10-8 MG/5ML LQCR    WBC at 3.3 which is not much different than her last of 3.5.  With her immunocompromised state will monitor closely.  Today sounds mostly viral with dry cough, will treat symptoms but d/w pt close monitoring and if symptoms do not improve with symptomatic care will need abx, can call in omnicef 300 mg bid for 10d.

## 2011-11-28 ENCOUNTER — Telehealth: Payer: Self-pay | Admitting: Hematology & Oncology

## 2011-11-28 NOTE — Telephone Encounter (Signed)
Left pt message with 4-3 appointment details

## 2011-12-27 ENCOUNTER — Other Ambulatory Visit: Payer: BLUE CROSS/BLUE SHIELD | Admitting: Lab

## 2011-12-27 ENCOUNTER — Ambulatory Visit: Payer: BLUE CROSS/BLUE SHIELD | Admitting: Hematology & Oncology

## 2012-01-05 ENCOUNTER — Other Ambulatory Visit: Payer: Self-pay | Admitting: *Deleted

## 2012-01-05 DIAGNOSIS — C9210 Chronic myeloid leukemia, BCR/ABL-positive, not having achieved remission: Secondary | ICD-10-CM

## 2012-01-05 MED ORDER — DASATINIB 70 MG PO TABS: 70.0000 mg | ORAL_TABLET | Freq: Every day | ORAL | Status: DC

## 2012-01-05 NOTE — Telephone Encounter (Signed)
Received refill request from [facility] for Sprycel 70 mg po daily. Refilled for 30 tabs only as she has not been in to see Dr Myna Hidalgo lately. Due later on this month.

## 2012-01-15 ENCOUNTER — Other Ambulatory Visit (HOSPITAL_BASED_OUTPATIENT_CLINIC_OR_DEPARTMENT_OTHER): Payer: BC Managed Care – PPO | Admitting: Lab

## 2012-01-15 ENCOUNTER — Ambulatory Visit: Payer: BLUE CROSS/BLUE SHIELD | Admitting: Hematology & Oncology

## 2012-01-26 ENCOUNTER — Encounter: Payer: Self-pay | Admitting: *Deleted

## 2012-01-26 NOTE — Progress Notes (Signed)
Got and confirmation of prescription shipment from [facility] for shipment on 01/18/12

## 2012-02-16 ENCOUNTER — Other Ambulatory Visit: Payer: Self-pay | Admitting: *Deleted

## 2012-02-16 DIAGNOSIS — C9210 Chronic myeloid leukemia, BCR/ABL-positive, not having achieved remission: Secondary | ICD-10-CM

## 2012-02-16 MED ORDER — DASATINIB 70 MG PO TABS: 70.0000 mg | ORAL_TABLET | Freq: Every day | ORAL | Status: DC

## 2012-02-16 NOTE — Telephone Encounter (Signed)
Received refill authorization for Sprycel 70 mg qd from [facility]. Faxed refill back to [phone] but also left message for pt stating that she needed to see Dr Myna Hidalgo soon since it has been over a year since he has seen her. She r/s'd her appt for early April then no-showed on the 4/22. Asked that she call [name] next week to r/s her appt.

## 2012-03-18 ENCOUNTER — Ambulatory Visit (HOSPITAL_BASED_OUTPATIENT_CLINIC_OR_DEPARTMENT_OTHER): Payer: BC Managed Care – PPO | Admitting: Hematology & Oncology

## 2012-03-18 ENCOUNTER — Other Ambulatory Visit (HOSPITAL_BASED_OUTPATIENT_CLINIC_OR_DEPARTMENT_OTHER): Payer: Self-pay | Admitting: Lab

## 2012-03-18 VITALS — BP 127/75 | HR 81 | Temp 98.0°F | Ht 65.0 in | Wt 195.0 lb

## 2012-03-18 DIAGNOSIS — C9211 Chronic myeloid leukemia, BCR/ABL-positive, in remission: Secondary | ICD-10-CM

## 2012-03-18 DIAGNOSIS — C9210 Chronic myeloid leukemia, BCR/ABL-positive, not having achieved remission: Secondary | ICD-10-CM

## 2012-03-18 LAB — COMPREHENSIVE METABOLIC PANEL
AST: 20 U/L (ref 0–37)
Albumin: 4.3 g/dL (ref 3.5–5.2)
BUN: 15 mg/dL (ref 6–23)
Calcium: 9.1 mg/dL (ref 8.4–10.5)
Chloride: 105 mEq/L (ref 96–112)
Glucose, Bld: 109 mg/dL — ABNORMAL HIGH (ref 70–99)
Potassium: 4 mEq/L (ref 3.5–5.3)

## 2012-03-18 LAB — CBC WITH DIFFERENTIAL (CANCER CENTER ONLY)
BASO#: 0 10*3/uL (ref 0.0–0.2)
Eosinophils Absolute: 0 10*3/uL (ref 0.0–0.5)
HGB: 12.9 g/dL (ref 11.6–15.9)
LYMPH#: 2.6 10*3/uL (ref 0.9–3.3)
MONO#: 0.3 10*3/uL (ref 0.1–0.9)
NEUT#: 2.8 10*3/uL (ref 1.5–6.5)
RBC: 4.11 10*6/uL (ref 3.70–5.32)

## 2012-03-18 NOTE — Progress Notes (Signed)
This office note has been dictated.

## 2012-03-19 NOTE — Progress Notes (Signed)
CC:   Peyton Najjar, MD  DIAGNOSIS:  Chronic phase CML.  CURRENT THERAPY:  Sprycel 70 mg p.o. daily.  INTERIM HISTORY:  Ms. Isaac comes in for followup.  She is doing very well.  She is busy with her newly adopted baby.  She just had the baby last year.  She has been very busy with her.  She just has not been able to make it in for her appointments because of her work schedule and taking care of her new baby.  She was able to make it in today.  So far, she has been in a major molecular remission.  The last level that we have on her was 0.021 for her BCR-ABL.  She is doing great on Sprycel.  She, unfortunately, has not been able to work out so she is a little bit unhappy with her weight.  She says she has put on quite a bit weight since her baby came to her.  Ms. Sulton and her family are going to [place] this Sunday.  They will be gone for a month.  She has had no headache.  She is not sleeping all that well but this is a chronic issue for her.  She has had no change in bowel or bladder habits.  There has been no cough or shortness of breath.  She has not noticed any leg swelling.  PHYSICAL EXAMINATION:  This is a well-developed, well-nourished white female in no obvious distress.  Vital signs:  Temperature of 98, pulse 81, respiratory rate 18, blood pressure 127/75.  Weight is 195 pounds. Head and neck:  Normocephalic, atraumatic skull.  There are no ocular or oral lesions.  There are no palpable cervical or supraclavicular lymph nodes.  Lungs:  Clear bilaterally.  Cardiac:  Regular rate and rhythm with a normal S1 and S2.  There are no murmurs, rubs or bruits. Abdomen:  Soft with good bowel sounds.  There is no palpable abdominal mass.  There is no fluid wave.  There is no palpable hepatosplenomegaly. Back:  No tenderness over the spine, ribs, or hips.  Extremities:  No clubbing, cyanosis or edema.  Neurological:  No focal neurological deficits.  LABORATORY STUDIES:   White cell count is 5.8, hemoglobin 12.9, hematocrit 37.6 and platelet count 208.  Peripheral blood smear shows good maturation of her white blood cells. I do not see any immature myeloid or lymphoid cells.  She has no blasts. There are no nucleated red blood cells.  Platelets are adequate in number and size.  IMPRESSION:  Ms. Bolinger is a 41 year old white female with chronic phase CML.  We have been following her now since November 2008.  She was not tolerant of Gleevec and actually broke through [facility] pretty quickly. We then got her on Sprycel and since being on Sprycel, she has attained a good major molecular remission.  We will checking her BCR-ABL ratio today.  We will plan to get her back in 6 months' time.  She would be happy with 6 month followup.    ______________________________ Josph Macho, M.D. PRE/MEDQ  D:  03/18/2012  T:  03/19/2012  Job:  [id number]  ADDENDUM:  BCR-ABL is (-). Therefore MMR.

## 2012-03-29 ENCOUNTER — Other Ambulatory Visit: Payer: Self-pay | Admitting: *Deleted

## 2012-03-29 DIAGNOSIS — C9210 Chronic myeloid leukemia, BCR/ABL-positive, not having achieved remission: Secondary | ICD-10-CM

## 2012-03-29 MED ORDER — DASATINIB 70 MG PO TABS: 70.0000 mg | ORAL_TABLET | Freq: Every day | ORAL | Status: DC

## 2012-03-29 NOTE — Telephone Encounter (Signed)
Received refill authorization for Sprycel 70 mg qd from [facility]. This is a chronic medication. Faxed refill back to [phone].

## 2012-07-12 ENCOUNTER — Other Ambulatory Visit: Payer: Self-pay | Admitting: *Deleted

## 2012-07-12 DIAGNOSIS — C9210 Chronic myeloid leukemia, BCR/ABL-positive, not having achieved remission: Secondary | ICD-10-CM

## 2012-07-12 MED ORDER — DASATINIB 70 MG PO TABS: 70.0000 mg | ORAL_TABLET | Freq: Every day | ORAL | Status: DC

## 2012-07-12 NOTE — Telephone Encounter (Signed)
Received refill authorization for Sprycel 70 mg qd from [facility]. This is a chronic medication. Sent via e-prescribe.

## 2012-07-15 ENCOUNTER — Other Ambulatory Visit: Payer: Self-pay | Admitting: *Deleted

## 2012-07-15 DIAGNOSIS — Z72820 Sleep deprivation: Secondary | ICD-10-CM

## 2012-07-15 MED ORDER — ZOLPIDEM TARTRATE 5 MG PO TABS: ORAL_TABLET | ORAL | Status: DC

## 2012-08-29 ENCOUNTER — Telehealth: Payer: Self-pay | Admitting: Hematology & Oncology

## 2012-08-29 NOTE — Telephone Encounter (Signed)
Patient called and cx 09/02/12 apt due to mother passing away.  She resch apt for 10/02/12

## 2012-09-02 ENCOUNTER — Other Ambulatory Visit: Payer: BC Managed Care – PPO | Admitting: Lab

## 2012-09-02 ENCOUNTER — Ambulatory Visit: Payer: BC Managed Care – PPO | Admitting: Hematology & Oncology

## 2012-10-01 ENCOUNTER — Telehealth: Payer: Self-pay | Admitting: Hematology & Oncology

## 2012-10-01 NOTE — Telephone Encounter (Signed)
Pt cx 1-8 will call back to reschedule she is out of town

## 2012-10-02 ENCOUNTER — Other Ambulatory Visit: Payer: Self-pay | Admitting: Lab

## 2012-10-02 ENCOUNTER — Ambulatory Visit: Payer: Self-pay | Admitting: Hematology & Oncology

## 2012-10-22 ENCOUNTER — Ambulatory Visit (INDEPENDENT_AMBULATORY_CARE_PROVIDER_SITE_OTHER): Payer: BC Managed Care – PPO | Admitting: Family Medicine

## 2012-10-22 ENCOUNTER — Ambulatory Visit: Payer: BC Managed Care – PPO

## 2012-10-22 VITALS — BP 142/82 | Temp 98.3°F | Ht 65.0 in | Wt 206.6 lb

## 2012-10-22 DIAGNOSIS — R079 Chest pain, unspecified: Secondary | ICD-10-CM

## 2012-10-22 DIAGNOSIS — J069 Acute upper respiratory infection, unspecified: Secondary | ICD-10-CM

## 2012-10-22 DIAGNOSIS — J40 Bronchitis, not specified as acute or chronic: Secondary | ICD-10-CM

## 2012-10-22 DIAGNOSIS — C9210 Chronic myeloid leukemia, BCR/ABL-positive, not having achieved remission: Secondary | ICD-10-CM

## 2012-10-22 LAB — POCT CBC
HCT, POC: 40.5 % (ref 37.7–47.9)
Hemoglobin: 12.6 g/dL (ref 12.2–16.2)
Lymph, poc: 2.8 (ref 0.6–3.4)
MCHC: 31.1 g/dL — AB (ref 31.8–35.4)
POC Granulocyte: 4.7 (ref 2–6.9)
WBC: 7.9 10*3/uL (ref 4.6–10.2)

## 2012-10-22 MED ORDER — CEFDINIR 300 MG PO CAPS: 300.0000 mg | ORAL_CAPSULE | Freq: Two times a day (BID) | ORAL | Status: DC

## 2012-10-22 NOTE — Patient Instructions (Signed)
Drink plenty of fluids Get enough rest Return if worse 

## 2012-10-22 NOTE — Progress Notes (Signed)
Subjective: Patient is here with a history of discomfort in her chest. She's had an upper sparked or infection for about 3 weeks. He mostly had congestion, but has some cough. She's got a chest discomfort now related to this. She's not been running a fever. Not significantly short of breath. She is tired. She has a history of a chronic myelogenous leukemia, which has been stable is under the care of an oncologist and on chronic medication since I saw her initially 5 years ago. She has an adopted child at home.  Objective: Pleasant lady in no major distress. TMs normal. Throat with moderate on the right and she was told to try washing it out at home. She can use OTC drops. Her throat was clear. Neck supple without nodes. Chest is clear to auscultation. Heart regular without murmurs.  [facility] reading (PRIMARY) by  Dr. Alwyn Ren Increased bronchovascular markings but no clear-cut pneumonia  Results for orders placed in visit on 10/22/12  POCT CBC      Component Value Range   WBC 7.9  4.6 - 10.2 K/uL   Lymph, poc 2.8  0.6 - 3.4   POC LYMPH PERCENT 35.0  10 - 50 %L   MID (cbc) 0.5  0 - 0.9   POC MID % 5.7  0 - 12 %M   POC Granulocyte 4.7  2 - 6.9   Granulocyte percent 59.3  37 - 80 %G   RBC 4.28  4.04 - 5.48 M/uL   Hemoglobin 12.6  12.2 - 16.2 g/dL   HCT, POC 16.1  09.6 - 47.9 %   MCV 94.6  80 - 97 fL   MCH, POC 29.4  27 - 31.2 pg   MCHC 31.1 (*) 31.8 - 35.4 g/dL   RDW, POC 04.5     Platelet Count, POC 353  142 - 424 K/uL   MPV 8.3  0 - 99.8 fL   . Assessment: URI and bronchitis Chronic myelogenous leukemia  Plan: We'll go ahead and treat with antibiotics since is going to long.

## 2012-11-01 ENCOUNTER — Other Ambulatory Visit: Payer: Self-pay | Admitting: *Deleted

## 2012-11-01 DIAGNOSIS — C9210 Chronic myeloid leukemia, BCR/ABL-positive, not having achieved remission: Secondary | ICD-10-CM

## 2012-11-01 MED ORDER — DASATINIB 70 MG PO TABS: 70.0000 mg | ORAL_TABLET | Freq: Every day | ORAL | Status: DC

## 2012-11-01 NOTE — Telephone Encounter (Signed)
Received refill authorization for Sprycel 70 mg qd from [facility]. This is a chronic medication. Sent via e-prescribe FOR A ONE MONTH SUPPLY. She has not kept her 6 month f/u appt. Will ask [name] to call the pt to schedule her appt.

## 2012-11-04 ENCOUNTER — Telehealth: Payer: Self-pay | Admitting: Hematology & Oncology

## 2012-11-04 NOTE — Telephone Encounter (Signed)
Per in basket I called pt and left message that Dr. Myna Hidalgo needs to see her and to call for an appointment.

## 2012-11-04 NOTE — Telephone Encounter (Signed)
Pt aware of 2-11 appointment

## 2012-11-05 ENCOUNTER — Other Ambulatory Visit (HOSPITAL_BASED_OUTPATIENT_CLINIC_OR_DEPARTMENT_OTHER): Payer: BC Managed Care – PPO | Admitting: Lab

## 2012-11-05 ENCOUNTER — Ambulatory Visit (HOSPITAL_BASED_OUTPATIENT_CLINIC_OR_DEPARTMENT_OTHER): Payer: BC Managed Care – PPO | Admitting: Hematology & Oncology

## 2012-11-05 VITALS — BP 135/77 | HR 69 | Temp 98.6°F | Resp 16 | Ht 65.0 in | Wt 207.0 lb

## 2012-11-05 DIAGNOSIS — C9210 Chronic myeloid leukemia, BCR/ABL-positive, not having achieved remission: Secondary | ICD-10-CM

## 2012-11-05 LAB — CBC WITH DIFFERENTIAL (CANCER CENTER ONLY)
BASO#: 0 10*3/uL (ref 0.0–0.2)
BASO%: 0.3 % (ref 0.0–2.0)
Eosinophils Absolute: 0.1 10*3/uL (ref 0.0–0.5)
HCT: 35.6 % (ref 34.8–46.6)
HGB: 11.8 g/dL (ref 11.6–15.9)
LYMPH%: 42.7 % (ref 14.0–48.0)
MCV: 93 fL (ref 81–101)
MONO#: 0.4 10*3/uL (ref 0.1–0.9)
NEUT%: 49.5 % (ref 39.6–80.0)
RDW: 14.4 % (ref 11.1–15.7)
WBC: 6.6 10*3/uL (ref 3.9–10.0)

## 2012-11-05 NOTE — Progress Notes (Signed)
This office note has been dictated.

## 2012-11-06 NOTE — Progress Notes (Signed)
DIAGNOSIS:  Chronic phase chronic myelogenous leukemia.  CURRENT THERAPY:  Sprycel 70 mg p.o. daily.  INTERIM HISTORY:  Victoria [name] comes in for followup.  She has had a tough time since we last saw her.  Her mother died back in 08-26-2023.  This was quite difficult for her.  She has gone through some bouts of depression.  She has gained some weight.  On a bright side, her adopted daughter is really doing well.  She is 66 months old now.  Victoria [name] CML has not had any issues.  When we last saw her in June, there was no detectable BCR/ABL fusion transcript noted.  She has had no fevers, sweats, or chills.  She had a little bit of a bronchitis a week or so ago.  She has had no change in bowel or bladder habits. She is not sleeping all that well.  This has been a chronic issue for her.  PHYSICAL EXAMINATION:  This is a well-developed, well-nourished white female in no obvious distress.  Vital signs:  Temperature of 98.6, pulse 69, respiratory rate 16, blood pressure 135/77.  Weight is 207.  Head and neck:  Normocephalic, atraumatic skull.  There are no ocular or oral lesions.  There are no palpable cervical or supraclavicular lymph nodes. Lungs:  Clear bilaterally.  Cardiac:  Regular rate and rhythm with a normal S1, S2.  There are no murmurs, rubs or bruits.  Abdomen:  Soft with good bowel sounds.  There is no palpable abdominal mass.  There is no palpable hepatosplenomegaly.  Extremities:  No clubbing, cyanosis or edema.  Neurological:  No focal neurological deficits.  Skin:  No rashes, ecchymosis or petechia.  LABORATORY STUDIES:  White cell count of 6.6, hemoglobin 11.8, hematocrit 35.6, platelet count 201.  Peripheral smear shows good maturation of her white blood cells.  There are no immature myeloid cells. I see no atypical lymphocytes.  There are no nucleated red blood cells.  Platelets are adequate in number and size.  IMPRESSION:  Victoria [name] is a very charming  42 year old white female with CML.  She has chronic phase chronic myeloid leukemia.  We have been seeing her now for over 5 years.  She was diagnosed back in 08/26/2007.  She is doing great on Sprycel.  For now, we will continue her on the Sprycel.  Of note, she had a chest x-ray done a couple weeks ago.  There is no evidence of infiltrate.  There is no effusion.  We will see her back in 8 months.    ______________________________ Josph Macho, M.D. PRE/MEDQ  D:  11/05/2012  T:  11/06/2012  Job:  [id number]

## 2012-11-12 ENCOUNTER — Telehealth: Payer: Self-pay | Admitting: *Deleted

## 2012-11-12 NOTE — Telephone Encounter (Signed)
Message copied by Anselm Jungling on Tue Nov 12, 2012 11:11 AM ------      Message from: Arlan Organ R      Created: Mon Nov 11, 2012  6:11 PM       Call - CML is still in remission!!!!  [place] job!!  Cindee Lame ------

## 2012-11-12 NOTE — Telephone Encounter (Signed)
Called patient to let her know that her CML is still in remission per dr. Lupita Leash.  Left message on personal cell phone

## 2012-12-06 ENCOUNTER — Other Ambulatory Visit: Payer: Self-pay | Admitting: *Deleted

## 2012-12-06 DIAGNOSIS — C9210 Chronic myeloid leukemia, BCR/ABL-positive, not having achieved remission: Secondary | ICD-10-CM

## 2012-12-06 MED ORDER — DASATINIB 70 MG PO TABS: 70.0000 mg | ORAL_TABLET | Freq: Every day | ORAL | Status: DC

## 2013-04-18 ENCOUNTER — Other Ambulatory Visit: Payer: Self-pay | Admitting: *Deleted

## 2013-04-18 DIAGNOSIS — C9210 Chronic myeloid leukemia, BCR/ABL-positive, not having achieved remission: Secondary | ICD-10-CM

## 2013-04-18 MED ORDER — DASATINIB 70 MG PO TABS: 70.0000 mg | ORAL_TABLET | Freq: Every day | ORAL | Status: DC

## 2013-07-07 ENCOUNTER — Ambulatory Visit: Payer: BC Managed Care – PPO | Admitting: Hematology & Oncology

## 2013-07-07 ENCOUNTER — Other Ambulatory Visit: Payer: BC Managed Care – PPO | Admitting: Lab

## 2013-07-07 ENCOUNTER — Telehealth: Payer: Self-pay | Admitting: Hematology & Oncology

## 2013-07-07 NOTE — Telephone Encounter (Signed)
Patient called and cx 07/07/13 apt due to being sick.  She stated she will call back to resch.  RN was notified of cx apt

## 2013-07-16 ENCOUNTER — Telehealth: Payer: Self-pay | Admitting: Hematology & Oncology

## 2013-07-16 NOTE — Telephone Encounter (Signed)
Pt called made 10-06-13 appointment

## 2013-08-04 ENCOUNTER — Other Ambulatory Visit: Payer: Self-pay | Admitting: Obstetrics & Gynecology

## 2013-08-04 ENCOUNTER — Encounter (HOSPITAL_COMMUNITY): Payer: Self-pay | Admitting: Pharmacist

## 2013-08-08 ENCOUNTER — Other Ambulatory Visit: Payer: Self-pay | Admitting: Obstetrics & Gynecology

## 2013-08-11 ENCOUNTER — Inpatient Hospital Stay (HOSPITAL_COMMUNITY): Admission: RE | Admit: 2013-08-11 | Payer: BC Managed Care – PPO | Source: Ambulatory Visit

## 2013-08-18 ENCOUNTER — Ambulatory Visit (HOSPITAL_COMMUNITY)
Admission: RE | Admit: 2013-08-18 | Payer: BC Managed Care – PPO | Source: Ambulatory Visit | Admitting: Obstetrics & Gynecology

## 2013-08-18 SURGERY — DILATATION & CURETTAGE/HYSTEROSCOPY WITH RESECTOCOPE
Anesthesia: Choice

## 2013-10-03 ENCOUNTER — Telehealth: Payer: Self-pay | Admitting: Hematology & Oncology

## 2013-10-03 NOTE — Telephone Encounter (Signed)
Pt moved 1-12 to 2-16, issue with insurance card. She needed m,t or wen after 12 pm

## 2013-10-06 ENCOUNTER — Ambulatory Visit: Payer: BC Managed Care – PPO | Admitting: Hematology & Oncology

## 2013-10-06 ENCOUNTER — Other Ambulatory Visit: Payer: BC Managed Care – PPO | Admitting: Lab

## 2013-10-14 ENCOUNTER — Encounter (HOSPITAL_COMMUNITY): Payer: Self-pay

## 2013-10-15 ENCOUNTER — Other Ambulatory Visit: Payer: Self-pay | Admitting: Obstetrics & Gynecology

## 2013-10-20 NOTE — Patient Instructions (Addendum)
   Your procedure is scheduled on:  Monday, Feb 2  Enter through the [employer] [facility] at: [place] up the phone at the desk and dial [phone] and inform us of your arrival.  Please call this number if you have any problems the morning of surgery: [phone]  Remember: Do not eat or drink after midnight: Sunday Take these medicines the morning of surgery with a SIP OF WATER:  None  Do not wear jewelry, make-up, or FINGER nail polish No metal in your hair or on your body. Do not wear lotions, powders, perfumes.  You may wear deodorant.  Do not bring valuables to the hospital. Contacts, dentures or bridgework may not be worn into surgery.  Patients discharged on the day of surgery will not be allowed to drive home.  Home with husband Sherren Mocha cell [phone].

## 2013-10-21 ENCOUNTER — Encounter (HOSPITAL_COMMUNITY)
Admission: RE | Admit: 2013-10-21 | Discharge: 2013-10-21 | Disposition: A | Discharge status: Home / Self Care | Payer: BC Managed Care – PPO | Source: Ambulatory Visit | Attending: Obstetrics & Gynecology | Admitting: Obstetrics & Gynecology

## 2013-10-21 ENCOUNTER — Encounter (HOSPITAL_COMMUNITY): Payer: Self-pay

## 2013-10-21 ENCOUNTER — Inpatient Hospital Stay (HOSPITAL_COMMUNITY): Admission: RE | Admit: 2013-10-21 | Payer: BC Managed Care – PPO | Source: Ambulatory Visit

## 2013-10-21 DIAGNOSIS — Z01818 Encounter for other preprocedural examination: Secondary | ICD-10-CM | POA: Insufficient documentation

## 2013-10-21 DIAGNOSIS — Z01812 Encounter for preprocedural laboratory examination: Secondary | ICD-10-CM | POA: Insufficient documentation

## 2013-10-21 HISTORY — DX: Nausea with vomiting, unspecified: Z98.890

## 2013-10-21 HISTORY — DX: Chronic myeloid leukemia, BCR/ABL-positive, not having achieved remission: C92.10

## 2013-10-21 HISTORY — DX: Other specified postprocedural states: R11.2

## 2013-10-21 HISTORY — DX: Anemia, unspecified: D64.9

## 2013-10-21 LAB — CBC
HCT: 38.3 % (ref 36.0–46.0)
Hemoglobin: 13 g/dL (ref 12.0–15.0)
MCH: 31 pg (ref 26.0–34.0)
MCHC: 33.9 g/dL (ref 30.0–36.0)
MCV: 91.4 fL (ref 78.0–100.0)
PLATELETS: 254 10*3/uL (ref 150–400)
RBC: 4.19 MIL/uL (ref 3.87–5.11)
RDW: 15.1 % (ref 11.5–15.5)
WBC: 5.3 10*3/uL (ref 4.0–10.5)

## 2013-10-27 ENCOUNTER — Ambulatory Visit (HOSPITAL_COMMUNITY)
Admission: RE | Admit: 2013-10-27 | Discharge: 2013-10-27 | Disposition: A | Discharge status: Home / Self Care | Payer: BC Managed Care – PPO | Source: Ambulatory Visit | Attending: Obstetrics & Gynecology | Admitting: Obstetrics & Gynecology

## 2013-10-27 ENCOUNTER — Encounter (HOSPITAL_COMMUNITY): Payer: Self-pay | Admitting: Anesthesiology

## 2013-10-27 ENCOUNTER — Encounter (HOSPITAL_COMMUNITY)
Admission: RE | Disposition: A | Discharge status: Home / Self Care | Payer: Self-pay | Source: Ambulatory Visit | Attending: Obstetrics & Gynecology

## 2013-10-27 ENCOUNTER — Ambulatory Visit (HOSPITAL_COMMUNITY): Payer: BC Managed Care – PPO | Admitting: Anesthesiology

## 2013-10-27 ENCOUNTER — Encounter (HOSPITAL_COMMUNITY): Payer: BC Managed Care – PPO | Admitting: Anesthesiology

## 2013-10-27 DIAGNOSIS — R9389 Abnormal findings on diagnostic imaging of other specified body structures: Secondary | ICD-10-CM | POA: Insufficient documentation

## 2013-10-27 DIAGNOSIS — N854 Malposition of uterus: Secondary | ICD-10-CM | POA: Insufficient documentation

## 2013-10-27 DIAGNOSIS — N840 Polyp of corpus uteri: Secondary | ICD-10-CM | POA: Insufficient documentation

## 2013-10-27 DIAGNOSIS — N921 Excessive and frequent menstruation with irregular cycle: Secondary | ICD-10-CM | POA: Insufficient documentation

## 2013-10-27 HISTORY — DX: Anxiety disorder, unspecified: F41.9

## 2013-10-27 HISTORY — DX: Major depressive disorder, single episode, unspecified: F32.9

## 2013-10-27 HISTORY — DX: Depression, unspecified: F32.A

## 2013-10-27 HISTORY — PX: DILATATION & CURRETTAGE/HYSTEROSCOPY WITH RESECTOCOPE: SHX5572

## 2013-10-27 LAB — PREGNANCY, URINE: Preg Test, Ur: NEGATIVE

## 2013-10-27 SURGERY — DILATATION & CURETTAGE/HYSTEROSCOPY WITH RESECTOCOPE
Anesthesia: General | Site: Vagina

## 2013-10-27 MED ORDER — SCOPOLAMINE 1 MG/3DAYS TD PT72
1.0000 | MEDICATED_PATCH | TRANSDERMAL | Status: DC
  Administered 2013-10-27: 1.5 mg via TRANSDERMAL

## 2013-10-27 MED ORDER — PROMETHAZINE HCL 25 MG/ML IJ SOLN
INTRAMUSCULAR | Status: AC
  Filled 2013-10-27: qty 1

## 2013-10-27 MED ORDER — PROMETHAZINE HCL 25 MG/ML IJ SOLN
6.2500 mg | INTRAMUSCULAR | Status: DC | PRN
  Administered 2013-10-27: 6.25 mg via INTRAVENOUS

## 2013-10-27 MED ORDER — PROPOFOL 10 MG/ML IV EMUL
INTRAVENOUS | Status: AC
  Filled 2013-10-27: qty 20

## 2013-10-27 MED ORDER — CEFAZOLIN SODIUM-DEXTROSE 2-3 GM-% IV SOLR
INTRAVENOUS | Status: AC
  Filled 2013-10-27: qty 50

## 2013-10-27 MED ORDER — DEXAMETHASONE SODIUM PHOSPHATE 10 MG/ML IJ SOLN
INTRAMUSCULAR | Status: DC | PRN
  Administered 2013-10-27: 10 mg via INTRAVENOUS

## 2013-10-27 MED ORDER — LACTATED RINGERS IV SOLN
INTRAVENOUS | Status: DC
  Administered 2013-10-27 (×2): via INTRAVENOUS

## 2013-10-27 MED ORDER — MIDAZOLAM HCL 2 MG/2ML IJ SOLN: 0.5000 mg | Freq: Once | INTRAMUSCULAR | Status: DC | PRN

## 2013-10-27 MED ORDER — CEFAZOLIN SODIUM-DEXTROSE 2-3 GM-% IV SOLR
2.0000 g | INTRAVENOUS | Status: AC
  Administered 2013-10-27: 2 g via INTRAVENOUS

## 2013-10-27 MED ORDER — KETOROLAC TROMETHAMINE 30 MG/ML IJ SOLN
INTRAMUSCULAR | Status: DC | PRN
  Administered 2013-10-27: 30 mg via INTRAVENOUS

## 2013-10-27 MED ORDER — MIDAZOLAM HCL 2 MG/2ML IJ SOLN
INTRAMUSCULAR | Status: DC | PRN
  Administered 2013-10-27: 2 mg via INTRAVENOUS

## 2013-10-27 MED ORDER — FENTANYL CITRATE 0.05 MG/ML IJ SOLN
25.0000 ug | INTRAMUSCULAR | Status: DC | PRN
  Administered 2013-10-27 (×3): 50 ug via INTRAVENOUS

## 2013-10-27 MED ORDER — SCOPOLAMINE 1 MG/3DAYS TD PT72
MEDICATED_PATCH | TRANSDERMAL | Status: AC
  Administered 2013-10-27: 1.5 mg via TRANSDERMAL
  Filled 2013-10-27: qty 1

## 2013-10-27 MED ORDER — DEXAMETHASONE SODIUM PHOSPHATE 10 MG/ML IJ SOLN
INTRAMUSCULAR | Status: AC
  Filled 2013-10-27: qty 1

## 2013-10-27 MED ORDER — FENTANYL CITRATE 0.05 MG/ML IJ SOLN
INTRAMUSCULAR | Status: DC | PRN
  Administered 2013-10-27 (×4): 50 ug via INTRAVENOUS

## 2013-10-27 MED ORDER — ONDANSETRON HCL 4 MG/2ML IJ SOLN
INTRAMUSCULAR | Status: DC | PRN
  Administered 2013-10-27: 4 mg via INTRAVENOUS

## 2013-10-27 MED ORDER — FENTANYL CITRATE 0.05 MG/ML IJ SOLN
INTRAMUSCULAR | Status: AC
  Filled 2013-10-27: qty 2

## 2013-10-27 MED ORDER — CHLOROPROCAINE HCL 1 % IJ SOLN
INTRAMUSCULAR | Status: AC
  Filled 2013-10-27: qty 30

## 2013-10-27 MED ORDER — KETOROLAC TROMETHAMINE 30 MG/ML IJ SOLN
INTRAMUSCULAR | Status: AC
  Filled 2013-10-27: qty 1

## 2013-10-27 MED ORDER — MEPERIDINE HCL 25 MG/ML IJ SOLN: 6.2500 mg | INTRAMUSCULAR | Status: DC | PRN

## 2013-10-27 MED ORDER — MIDAZOLAM HCL 2 MG/2ML IJ SOLN
INTRAMUSCULAR | Status: AC
  Filled 2013-10-27: qty 2

## 2013-10-27 MED ORDER — CHLOROPROCAINE HCL 1 % IJ SOLN
INTRAMUSCULAR | Status: DC | PRN
  Administered 2013-10-27: 20 mL

## 2013-10-27 MED ORDER — KETOROLAC TROMETHAMINE 30 MG/ML IJ SOLN: 15.0000 mg | Freq: Once | INTRAMUSCULAR | Status: DC | PRN

## 2013-10-27 MED ORDER — LIDOCAINE HCL (CARDIAC) 20 MG/ML IV SOLN
INTRAVENOUS | Status: AC
  Filled 2013-10-27: qty 5

## 2013-10-27 MED ORDER — LIDOCAINE HCL (CARDIAC) 20 MG/ML IV SOLN
INTRAVENOUS | Status: DC | PRN
  Administered 2013-10-27: 80 mg via INTRAVENOUS

## 2013-10-27 MED ORDER — FENTANYL CITRATE 0.05 MG/ML IJ SOLN
INTRAMUSCULAR | Status: AC
  Filled 2013-10-27: qty 5

## 2013-10-27 MED ORDER — ONDANSETRON HCL 4 MG/2ML IJ SOLN
INTRAMUSCULAR | Status: AC
  Filled 2013-10-27: qty 2

## 2013-10-27 MED ORDER — GLYCINE 1.5 % IR SOLN
Status: DC | PRN
  Administered 2013-10-27 (×3): 3000 mL

## 2013-10-27 MED ORDER — PROPOFOL 10 MG/ML IV BOLUS
INTRAVENOUS | Status: DC | PRN
  Administered 2013-10-27: 40 mg via INTRAVENOUS
  Administered 2013-10-27: 180 mg via INTRAVENOUS

## 2013-10-27 MED ORDER — GLYCOPYRROLATE 0.2 MG/ML IJ SOLN
INTRAMUSCULAR | Status: AC
  Filled 2013-10-27: qty 1

## 2013-10-27 MED ORDER — HYDROCODONE-IBUPROFEN 5-200 MG PO TABS
1.0000 | ORAL_TABLET | Freq: Four times a day (QID) | ORAL | Status: DC | PRN
Start: 2013-10-27 — End: 2016-06-26

## 2013-10-27 MED ORDER — FENTANYL CITRATE 0.05 MG/ML IJ SOLN
INTRAMUSCULAR | Status: DC
Start: 2013-10-27 — End: 2013-10-27
  Filled 2013-10-27: qty 2

## 2013-10-27 SURGICAL SUPPLY — 17 items
ABLATOR ENDOMETRIAL BIPOLAR (ABLATOR) IMPLANT
CANISTER SUCT 3000ML (MISCELLANEOUS) ×6 IMPLANT
CATH ROBINSON RED A/P 16FR (CATHETERS) ×2 IMPLANT
CLOTH BEACON ORANGE TIMEOUT ST (SAFETY) ×2 IMPLANT
CONTAINER PREFILL 10% NBF 60ML (FORM) ×2 IMPLANT
DRSG TELFA 3X8 NADH (GAUZE/BANDAGES/DRESSINGS) ×2 IMPLANT
ELECT REM PT RETURN 9FT ADLT (ELECTROSURGICAL) ×2
ELECTRODE REM PT RTRN 9FT ADLT (ELECTROSURGICAL) ×1 IMPLANT
GLOVE BIO SURGEON STRL SZ 6.5 (GLOVE) ×2 IMPLANT
GLOVE BIOGEL PI IND STRL 7.0 (GLOVE) ×1 IMPLANT
GLOVE BIOGEL PI INDICATOR 7.0 (GLOVE) ×1
GOWN STRL REUS W/TWL LRG LVL3 (GOWN DISPOSABLE) ×4 IMPLANT
LOOP ANGLED CUTTING 22FR (CUTTING LOOP) ×2 IMPLANT
PACK HYSTEROSCOPY LF (CUSTOM PROCEDURE TRAY) ×2 IMPLANT
PAD OB MATERNITY 4.3X12.25 (PERSONAL CARE ITEMS) ×2 IMPLANT
TOWEL OR 17X24 6PK STRL BLUE (TOWEL DISPOSABLE) ×4 IMPLANT
WATER STERILE IRR 1000ML POUR (IV SOLUTION) ×2 IMPLANT

## 2013-10-27 NOTE — Anesthesia Preprocedure Evaluation (Signed)
Anesthesia Evaluation  Patient identified by MRN, date of birth, ID band Patient awake    Reviewed: Allergy & Precautions, H&P , Patient's Chart, lab work & pertinent test results, reviewed documented beta blocker date and time   History of Anesthesia Complications (+) PONV and history of anesthetic complications  Airway Mallampati: II TM Distance: >3 FB Neck ROM: full    Dental   Pulmonary  breath sounds clear to auscultation        Cardiovascular Exercise Tolerance: Good Rhythm:regular Rate:Normal     Neuro/Psych negative psych ROS   GI/Hepatic   Endo/Other    Renal/GU      Musculoskeletal   Abdominal   Peds  Hematology  (+) Blood dyscrasia, anemia ,   Anesthesia Other Findings   Reproductive/Obstetrics                           Anesthesia Physical Anesthesia Plan  ASA: III  Anesthesia Plan: General LMA   Post-op Pain Management:    Induction:   Airway Management Planned:   Additional Equipment:   Intra-op Plan:   Post-operative Plan:   Informed Consent: I have reviewed the patients History and Physical, chart, labs and discussed the procedure including the risks, benefits and alternatives for the proposed anesthesia with the patient or authorized representative who has indicated his/her understanding and acceptance.   Dental Advisory Given  Plan Discussed with: CRNA, Surgeon and Anesthesiologist  Anesthesia Plan Comments:         Anesthesia Quick Evaluation

## 2013-10-27 NOTE — H&P (Signed)
Victoria [name] is an 43 y.o. female G41P0A1  RP:  Endometrial polyps for [facility] resection, D+C  Pertinent Gynecological History: Menses: flow is moderate Bleeding: intermenstrual bleeding Contraception: condoms Blood transfusions: none Sexually transmitted diseases: no past history Last mammogram: normal  Last pap: normal OB History: G1P0A1, 1 adopted child.   Menstrual History:  No LMP recorded.    Past Medical History  Diagnosis Date  . PONV (postoperative nausea and vomiting)   . Anxiety     no meds  . Depression     no meds  . Chronic myelogenous leukemia 2010  . Anemia     HX    Past Surgical History  Procedure Laterality Date  . Cholecystectomy    . Dilation and curettage of uterus      TAB  . Breast surgery      reduction    No family history on file.  Social History:  reports that she has never smoked. She has never used smokeless tobacco. She reports that she drinks alcohol. She reports that she does not use illicit drugs.  Allergies: No Known Allergies  Prescriptions prior to admission  Medication Sig Dispense Refill  . 5-Hydroxytryptophan (5-HTP PO) Take 1 tablet by mouth daily.      . dasatinib (SPRYCEL) 70 MG tablet Take 1 tablet (70 mg total) by mouth daily. LAST REFILL UNTIL SEES MD  30 tablet  6  . Amino Acids (L-CARNITINE PO) Take 1 tablet by mouth daily.      Marland Kitchen ibuprofen (ADVIL,MOTRIN) 200 MG tablet Take 400-600 mg by mouth every 6 (six) hours as needed (pain).      . Magnesium 500 MG TABS Take 1 tablet by mouth daily.      . Melatonin 5 MG TABS Take 5 mg by mouth at bedtime as needed.      . Omega-3 Fatty Acids (FISH OIL PO) Take 1 capsule by mouth daily.      Marland Kitchen POTASSIUM CHLORIDE ER PO Take 1 tablet by mouth daily.        ROS  Blood pressure 143/77, pulse 91, temperature 98.4 F (36.9 C), temperature source Oral, resp. rate 20, SpO2 98.00%. Physical Exam  [place]:  2 endometrial polyps   Results for orders placed during the  hospital encounter of 10/27/13 (from the past 24 hour(s))  PREGNANCY, URINE     Status: None   Collection Time    10/27/13  8:49 AM      Result Value Range   Preg Test, Ur NEGATIVE  NEGATIVE    No results found.  Assessment/Plan: Menometro with endometrial polyps for [place] resection, D+C.  Surgery and risks reviewed.  Victoria [name],Victoria [name] 10/27/2013, 9:14 AM

## 2013-10-27 NOTE — Transfer of Care (Signed)
Immediate Anesthesia Transfer of Care Note  Patient: Victoria [name]  Procedure(s) Performed: Procedure(s): DILATATION & CURETTAGE/HYSTEROSCOPY WITH RESECTOCOPE (N/A)  Patient Location: PACU  Anesthesia Type:General  Level of Consciousness: awake, alert  and oriented  Airway & Oxygen Therapy: Patient Spontanous Breathing and Patient connected to nasal cannula oxygen  Post-op Assessment: Report given to PACU RN, Post -op Vital signs reviewed and stable and Patient moving all extremities  Post vital signs: Reviewed and stable  Complications: No apparent anesthesia complications

## 2013-10-27 NOTE — Discharge Instructions (Addendum)
Hysteroscopy, Care After Refer to this sheet in the next few weeks. These instructions provide you with information on caring for yourself after your procedure. Your health care provider may also give you more specific instructions. Your treatment has been planned according to current medical practices, but problems sometimes occur. Call your health care provider if you have any problems or questions after your procedure.  WHAT TO EXPECT AFTER THE PROCEDURE After your procedure, it is typical to have the following:  You may have some cramping. This normally lasts for a couple days.  You may have bleeding. This can vary from light spotting for a few days to menstrual-like bleeding for 3 7 days. HOME CARE INSTRUCTIONS  Rest for the first 1 2 days after the procedure.  Only take over-the-counter or prescription medicines as directed by your health care provider. Do not take aspirin. It can increase the chances of bleeding.  Take showers instead of baths for 2 weeks or as directed by your health care provider.  Do not drive for 24 hours or as directed.  Do not drink alcohol while taking pain medicine.  Do not use tampons, douche, or have sexual intercourse for 2 weeks or until your health care provider says it is okay.  Take your temperature twice a day for 4 5 days. Write it down each time.  Follow your health care provider's advice about diet, exercise, and lifting.  If you develop constipation, you may:  Take a mild laxative if your health care provider approves.  Add bran foods to your diet.  Drink enough fluids to keep your urine clear or pale yellow.  Try to have someone with you or available to you for the first 24 48 hours, especially if you were given a general anesthetic.  Follow up with your health care provider as directed. SEEK MEDICAL CARE IF:  You feel dizzy or lightheaded.  You feel sick to your stomach (nauseous).  You have abnormal vaginal discharge.  You  have a rash.  You have pain that is not controlled with medicine. SEEK IMMEDIATE MEDICAL CARE IF:  You have bleeding that is heavier than a normal menstrual period.  You have a fever.  You have increasing cramps or pain, not controlled with medicine.  You have new belly (abdominal) pain.  You pass out.  You have pain in the tops of your shoulders (shoulder strap areas).  You have shortness of breath. Document Released: 07/02/2013 Document Reviewed: 04/10/2013 [facility] Patient Information 2014 [place], [place].    DO OT TAKE ANY IBUPROFEN PRODUCTS UNTIL 5 PM TODAY.  YOU RECEIVED A SIMILAR MEDICATION IN THE OPERATING ROOM TODAY AT 11 AM.

## 2013-10-27 NOTE — Preoperative (Signed)
Beta Blockers   Reason not to administer Beta Blockers:Not Applicable 

## 2013-10-27 NOTE — Discharge Summary (Signed)
  Physician Discharge Summary  Patient ID: KIMAYA WHITLATCH MRN: [id number] DOB/AGE: Nov 11, 1970 43 y.o.  Admit date: 10/27/2013 Discharge date: 10/27/2013  Admission Diagnoses: Endometrial Polyps  Discharge Diagnoses: Endometrial Polyps        Active Problems:   * No active hospital problems. *   Discharged Condition: good  Hospital Course: Outpatient  Consults: None  Treatments: surgery: Hysteroscopy resection with D+C  Disposition: Final discharge disposition not confirmed   Future Appointments Provider Department Dept Phone   11/10/2013 2:45 PM [place] [phone]   11/10/2013 3:15 PM Volanda Napoleon, MD [place] [phone]       Medication List         5-HTP PO  Take 1 tablet by mouth daily.     dasatinib 70 MG tablet  Commonly known as:  SPRYCEL  Take 1 tablet (70 mg total) by mouth daily. LAST REFILL UNTIL SEES MD     FISH OIL PO  Take 1 capsule by mouth daily.     hydrocodone-ibuprofen 5-200 MG per tablet  Commonly known as:  VICOPROFEN  Take 1 tablet by mouth every 6 (six) hours as needed for pain.     ibuprofen 200 MG tablet  Commonly known as:  ADVIL,MOTRIN  Take 400-600 mg by mouth every 6 (six) hours as needed (pain).     L-CARNITINE PO  Take 1 tablet by mouth daily.     Magnesium 500 MG Tabs  Take 1 tablet by mouth daily.     Melatonin 5 MG Tabs  Take 5 mg by mouth at bedtime as needed.     POTASSIUM CHLORIDE ER PO  Take 1 tablet by mouth daily.           Follow-up Information   Follow up with Azuree Minish,[name], MD In 3 weeks.   Specialty:  Obstetrics and Gynecology   Contact information:   [place]  [id number] [phone]       Signed: Princess Bruins, MD 10/27/2013, 11:31 AM

## 2013-10-27 NOTE — Op Note (Signed)
10/27/2013  11:19 AM  PATIENT:  Victoria [name]  43 y.o. female  PRE-OPERATIVE DIAGNOSIS:  Endometrial Polyps  POST-OPERATIVE DIAGNOSIS:  Thick endometrium with endometrial polyps  PROCEDURE:  Procedure(s): DILATATION & CURETTAGE/HYSTEROSCOPY WITH RESECTOCOPE  SURGEON:  Surgeon(s): Princess Bruins, MD  ASSISTANTS: none   ANESTHESIA:   general  PROCEDURE:  Under general anesthesia with laryngeal mask the patient is in lithotomy position. She is prepped with Betadine on the suprapubic, vulvar and vaginal areas. She is draped as usual. The vaginal exam reveals an anteverted uterus no adnexal mass. The speculum is inserted in the vagina and the anterior lip of the cervix is grasped with a tenaculum. The hysterometry is 10 cm. The dilation of the cervix with Hegar dilators up to #33 without difficulty. The operative hysteroscope is inserted in the intrauterine cavity with the resectoscope loop.  The endometrium is very sick with a polypoid appearance and 2 larger polyps.  All endometrial polyps are resected.  We then proceed with a systematic curettage of the intrauterine cavity on all surfaces with a sharp curet. Both specimens were sent together to pathology.  Because of the thick polypoid endometrium, bleeding inside the intrauterine cavity was hindering vision.  The small bleeders were controlled with coagulation and vision improved as we went along.  Pictures were taken before and after resection of the polyps. Hemostasis was adequate. The hysteroscope was removed. The tenaculum was removed from the cervix.  Silver nitrate was used on the cervix to complete hemostasis. The speculum was removed from the vagina. The patient was brought to recovery room in good and stable status.  ESTIMATED BLOOD LOSS: 100 cc  Fluid deficit:  190 cc   Intake/Output Summary (Last 24 hours) at 10/27/13 1119 Last data filed at 10/27/13 1109  Gross per 24 hour  Intake   1000 ml  Output    120 ml  Net     880 ml     BLOOD ADMINISTERED:none   LOCAL MEDICATIONS USED:  Nesacaine 1%, Amount: 20 ml  SPECIMEN:  Source of Specimen:  Endometrial curettings and resection of polyps  DISPOSITION OF SPECIMEN:  PATHOLOGY  COUNTS:  YES  PLAN OF CARE: Transfer to PACU  Princess Bruins MD  10/27/2013 at 11:20 am

## 2013-10-27 NOTE — Anesthesia Postprocedure Evaluation (Signed)
  Anesthesia Post-op Note  Anesthesia Post Note  Patient: Victoria [name]  Procedure(s) Performed: Procedure(s) (LRB): DILATATION & CURETTAGE/HYSTEROSCOPY WITH RESECTOCOPE (N/A)  Anesthesia type: General  Patient location: PACU  Post pain: Pain level controlled  Post assessment: Post-op Vital signs reviewed  Last Vitals:  Filed Vitals:   10/27/13 1230  BP: 121/71  Pulse: 79  Temp:   Resp: 15    Post vital signs: Reviewed  Level of consciousness: sedated  Complications: No apparent anesthesia complications

## 2013-10-28 ENCOUNTER — Encounter (HOSPITAL_COMMUNITY): Payer: Self-pay | Admitting: Obstetrics & Gynecology

## 2013-11-10 ENCOUNTER — Ambulatory Visit: Payer: BC Managed Care – PPO | Admitting: Hematology & Oncology

## 2013-11-10 ENCOUNTER — Other Ambulatory Visit: Payer: BC Managed Care – PPO | Admitting: Lab

## 2013-11-25 ENCOUNTER — Other Ambulatory Visit: Payer: Self-pay | Admitting: Nurse Practitioner

## 2014-01-01 ENCOUNTER — Other Ambulatory Visit: Payer: Self-pay | Admitting: *Deleted

## 2014-01-01 ENCOUNTER — Telehealth: Payer: Self-pay | Admitting: Hematology & Oncology

## 2014-01-01 ENCOUNTER — Telehealth: Payer: Self-pay | Admitting: *Deleted

## 2014-01-01 DIAGNOSIS — C9210 Chronic myeloid leukemia, BCR/ABL-positive, not having achieved remission: Secondary | ICD-10-CM

## 2014-01-01 MED ORDER — DASATINIB 70 MG PO TABS: 70.0000 mg | ORAL_TABLET | Freq: Every day | ORAL | Status: DC

## 2014-01-01 NOTE — Telephone Encounter (Signed)
Pt made 5-11 appointment

## 2014-01-01 NOTE — Telephone Encounter (Signed)
Patient made appt in May.  Ok to refill now per d.r ennever.

## 2014-01-01 NOTE — Telephone Encounter (Signed)
Called patient and left message that she has not been seen in office in over a year and has had several missed appointments.  Informed patient that we need to have an appointment on books for patient before we could refill this medication .  Asked patient to call us or Liliane Channel to set up appointment

## 2014-01-12 NOTE — Progress Notes (Signed)
Received fax from [facility] reporting pt's Sprycel shipped on 4/17 for next day delivery. dph

## 2014-02-02 ENCOUNTER — Other Ambulatory Visit (HOSPITAL_BASED_OUTPATIENT_CLINIC_OR_DEPARTMENT_OTHER): Payer: BC Managed Care – PPO | Admitting: Lab

## 2014-02-02 ENCOUNTER — Ambulatory Visit (HOSPITAL_BASED_OUTPATIENT_CLINIC_OR_DEPARTMENT_OTHER): Payer: BC Managed Care – PPO | Admitting: Hematology & Oncology

## 2014-02-02 ENCOUNTER — Other Ambulatory Visit: Payer: Self-pay | Admitting: Nurse Practitioner

## 2014-02-02 VITALS — BP 152/78 | HR 81 | Temp 98.4°F | Resp 16 | Ht 65.0 in | Wt 206.0 lb

## 2014-02-02 DIAGNOSIS — C9210 Chronic myeloid leukemia, BCR/ABL-positive, not having achieved remission: Secondary | ICD-10-CM

## 2014-02-02 DIAGNOSIS — G4700 Insomnia, unspecified: Secondary | ICD-10-CM

## 2014-02-02 DIAGNOSIS — C9211 Chronic myeloid leukemia, BCR/ABL-positive, in remission: Secondary | ICD-10-CM

## 2014-02-02 LAB — COMPREHENSIVE METABOLIC PANEL
ALT: 17 U/L (ref 0–35)
AST: 17 U/L (ref 0–37)
Albumin: 4.1 g/dL (ref 3.5–5.2)
Alkaline Phosphatase: 51 U/L (ref 39–117)
BUN: 16 mg/dL (ref 6–23)
CO2: 25 meq/L (ref 19–32)
Calcium: 9.3 mg/dL (ref 8.4–10.5)
Chloride: 100 mEq/L (ref 96–112)
Creatinine, Ser: 0.52 mg/dL (ref 0.50–1.10)
GLUCOSE: 90 mg/dL (ref 70–99)
POTASSIUM: 4.1 meq/L (ref 3.5–5.3)
Sodium: 135 mEq/L (ref 135–145)
TOTAL PROTEIN: 7.6 g/dL (ref 6.0–8.3)
Total Bilirubin: 0.5 mg/dL (ref 0.2–1.2)

## 2014-02-02 LAB — CBC WITH DIFFERENTIAL (CANCER CENTER ONLY)
BASO#: 0 10*3/uL (ref 0.0–0.2)
BASO%: 0.3 % (ref 0.0–2.0)
EOS ABS: 0.1 10*3/uL (ref 0.0–0.5)
EOS%: 0.9 % (ref 0.0–7.0)
HCT: 40.8 % (ref 34.8–46.6)
HGB: 14.2 g/dL (ref 11.6–15.9)
LYMPH#: 1.6 10*3/uL (ref 0.9–3.3)
LYMPH%: 22.8 % (ref 14.0–48.0)
MCH: 31.1 pg (ref 26.0–34.0)
MCHC: 34.8 g/dL (ref 32.0–36.0)
MCV: 89 fL (ref 81–101)
MONO#: 0.5 10*3/uL (ref 0.1–0.9)
MONO%: 7.2 % (ref 0.0–13.0)
NEUT%: 68.8 % (ref 39.6–80.0)
NEUTROS ABS: 4.8 10*3/uL (ref 1.5–6.5)
Platelets: 263 10*3/uL (ref 145–400)
RBC: 4.57 10*6/uL (ref 3.70–5.32)
RDW: 15 % (ref 11.1–15.7)
WBC: 6.9 10*3/uL (ref 3.9–10.0)

## 2014-02-02 MED ORDER — TRAZODONE HCL 50 MG PO TABS: 50.0000 mg | ORAL_TABLET | Freq: Every day | ORAL | Status: DC

## 2014-02-02 NOTE — Progress Notes (Signed)
Hematology and Oncology Follow Up Visit  Victoria [name] [id number] 1971/08/19 43 y.o. 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 daily.     Interim History:  Victoria [name] is in for a long awaited followup. Last are back in February 2014. She's done very very well. She's been in a major like her remission now probably for 5 years. She's done well with Sprycel. She's had no toxicity from this.  She is a working. She's having problems sleeping. There's been a chronic issue for her.  Depression is getting more of a problem. She is going to see a psychiatrist. I'll have to see of there is any antidepressant that might be contraindicated with her being on Sprycel.  Her last BCR/ABL analysis was still negative which would indicate that she still is in a major molecular remission.  She's had no problems bowels or bladder. She has had some uterine issues. She did have some polyps removed. Her cycles are still irregular.  Medications: Current outpatient prescriptions:5-Hydroxytryptophan (5-HTP PO), Take 1 tablet by mouth daily., Disp: , Rfl: ;  Amino Acids (L-CARNITINE PO), Take 1 tablet by mouth daily., Disp: , Rfl: ;  dasatinib (SPRYCEL) 70 MG tablet, Take 1 tablet (70 mg total) by mouth daily. LAST REFILL Burna Mortimer MD, Disp: 30 tablet, Rfl: 6;  ibuprofen (ADVIL,MOTRIN) 200 MG tablet, Take 400-600 mg by mouth every 6 (six) hours as needed (pain)., Disp: , Rfl:  Magnesium 500 MG TABS, Take 1 tablet by mouth daily., Disp: , Rfl: ;  Melatonin 5 MG TABS, Take 5 mg by mouth at bedtime as needed., Disp: , Rfl: ;  Omega-3 Fatty Acids (FISH OIL PO), Take 1 capsule by mouth daily., Disp: , Rfl: ;  POTASSIUM CHLORIDE ER PO, Take 1 tablet by mouth daily., Disp: , Rfl: ;  hydrocodone-ibuprofen (VICOPROFEN) 5-200 MG per tablet, Take 1 tablet by mouth every 6 (six) hours as needed for pain., Disp: 30 tablet, Rfl: 0 traZODone  (DESYREL) 50 MG tablet, Take 1 tablet (50 mg total) by mouth at bedtime., Disp: 60 tablet, Rfl: 3  Allergies: No Known Allergies  Past Medical History, Surgical history, Social history, and Family History were reviewed and updated.  Review of Systems: As above  Physical Exam:  height is 5' 5" (1.651 m) and weight is 206 lb (93.441 kg). Her temperature is 98.4 F (36.9 C). Her blood pressure is 152/78 and her pulse is 81. Her respiration is 16.   Well-developed and well-nourished white female. Head and neck exam shows no ocular or oral lesions. She has no palpable cervical or supraclavicular lymph nodes. Lungs are clear bilaterally. Cardiac exam regular rate and rhythm with no murmurs rubs or bruits. Abdomen soft. Has good bowel sounds. Is no fluid wave. There is no palpable liver or spleen tip. Back exam shows no tenderness over the spine ribs or hips. Extremities shows no clubbing cyanosis or edema. Skin exam shows no rashes. Neurological exam is nonfocal.  Lab Results  Component Value Date   WBC 6.9 02/02/2014   HGB 14.2 02/02/2014   HCT 40.8 02/02/2014   MCV 89 02/02/2014   PLT 263 02/02/2014     Chemistry      Component Value Date/Time   NA 135 02/02/2014 1453   K 4.1 02/02/2014 1453   CL 100 02/02/2014 1453   CO2 25 02/02/2014 1453   BUN 16 02/02/2014 1453  CREATININE 0.52 02/02/2014 1453      Component Value Date/Time   CALCIUM 9.3 02/02/2014 1453   ALKPHOS 51 02/02/2014 1453   AST 17 02/02/2014 1453   ALT 17 02/02/2014 1453   BILITOT 0.5 02/02/2014 1453         Impression and Plan: Victoria [name] is 43 year old white female with chronic phase CML. Again, she is in remission. We will recheck her BCR ABL ratio. I was told him that she is in remission.  I think one issue is whether or not she will be a wouldn't come off the Sprycel. I think some of the upcoming clinical trial results will help [place] with this. I would think that she would be a great candidate to come off Sprycel if we  do see that there is is no disadvantage to patients who have been in remission for 2 or 3 years. She has been in remission for 5 years.  I do realize that it has been hard for her to come in. Hopefully, we can get her back in 6 months.  She really does need a new family doctor. Am not sure who we can get for her.  Volanda Napoleon, MD 5/11/20156:40 PM

## 2014-02-04 NOTE — Progress Notes (Signed)
I left a message on her cell phone regarding antidepressants and Sprycel. I told her that one is that she could not be prescribed are Lexapro, Celexa, and Prozac. The antidepressants that seem to be recommended or Paxil, Zoloft, Cymbalta, and Effexor.  I told her that she can call me or her psychiatrist can call me if there are any questions.

## 2014-08-03 ENCOUNTER — Ambulatory Visit: Payer: BC Managed Care – PPO | Admitting: Hematology & Oncology

## 2014-08-03 ENCOUNTER — Other Ambulatory Visit: Payer: BC Managed Care – PPO | Admitting: Lab

## 2014-09-03 ENCOUNTER — Encounter: Payer: Self-pay | Admitting: Nurse Practitioner

## 2014-09-03 NOTE — Progress Notes (Signed)
Received notification from [facility] that pt's Revlimid was shipped on 09/02/14.

## 2014-09-16 ENCOUNTER — Other Ambulatory Visit: Payer: Self-pay | Admitting: *Deleted

## 2014-09-16 DIAGNOSIS — C9210 Chronic myeloid leukemia, BCR/ABL-positive, not having achieved remission: Secondary | ICD-10-CM

## 2014-09-16 MED ORDER — DASATINIB 70 MG PO TABS: 70.0000 mg | ORAL_TABLET | Freq: Every day | ORAL | Status: DC

## 2014-10-01 ENCOUNTER — Telehealth: Payer: Self-pay | Admitting: Hematology & Oncology

## 2014-10-01 NOTE — Telephone Encounter (Signed)
Pt made 2-8 appointment, and wanted to speak to [facility]. I transferred call to [facility]

## 2014-11-02 ENCOUNTER — Other Ambulatory Visit: Payer: Self-pay | Admitting: Lab

## 2014-11-02 ENCOUNTER — Ambulatory Visit: Payer: Self-pay | Admitting: Hematology & Oncology

## 2015-05-19 ENCOUNTER — Other Ambulatory Visit: Payer: Self-pay | Admitting: *Deleted

## 2015-05-19 DIAGNOSIS — C9210 Chronic myeloid leukemia, BCR/ABL-positive, not having achieved remission: Secondary | ICD-10-CM

## 2015-05-19 MED ORDER — DASATINIB 70 MG PO TABS: 70.0000 mg | ORAL_TABLET | Freq: Every day | ORAL | Status: DC

## 2015-08-02 NOTE — Progress Notes (Signed)
Received fax confirmation from [facility] that pt's Sprycel has shipped for delivery. dph

## 2015-10-06 ENCOUNTER — Ambulatory Visit (INDEPENDENT_AMBULATORY_CARE_PROVIDER_SITE_OTHER): Payer: BLUE CROSS/BLUE SHIELD | Admitting: Emergency Medicine

## 2015-10-06 VITALS — BP 130/80 | HR 119 | Temp 98.8°F | Resp 17 | Ht 65.5 in | Wt 211.0 lb

## 2015-10-06 DIAGNOSIS — J0140 Acute pansinusitis, unspecified: Secondary | ICD-10-CM

## 2015-10-06 DIAGNOSIS — J209 Acute bronchitis, unspecified: Secondary | ICD-10-CM | POA: Diagnosis not present

## 2015-10-06 MED ORDER — PSEUDOEPHEDRINE-GUAIFENESIN ER 60-600 MG PO TB12: 1.0000 | ORAL_TABLET | Freq: Two times a day (BID) | ORAL | Status: DC

## 2015-10-06 MED ORDER — AMOXICILLIN-POT CLAVULANATE 875-125 MG PO TABS
1.0000 | ORAL_TABLET | Freq: Two times a day (BID) | ORAL | Status: DC
Start: 2015-10-06 — End: 2016-06-26

## 2015-10-06 MED ORDER — HYDROCOD POLST-CPM POLST ER 10-8 MG/5ML PO SUER: 5.0000 mL | Freq: Two times a day (BID) | ORAL | Status: DC

## 2015-10-06 NOTE — Progress Notes (Signed)
Subjective:  Patient ID: Victoria [name], female    DOB: 1971-06-15  Age: 45 y.o. MRN: [id number]  CC: Ear Pain; Sore Throat; and Nasal Congestion   HPI Victoria [name] presents   She has fever chills and nasal congestion and postnasal drainage. She has pain in her left ear. Her nasal discharge is purulent character. She has pressure around her cheeks and forehead. She has sore throat. Has a cough productive purulent sputum. No wheezing or shortness of breath. She has no nausea vomiting or improvement with over-the-counter medication. She been ill for a week  History Victoria [name] has a past medical history of PONV (postoperative nausea and vomiting); Anxiety; Depression; Chronic myelogenous leukemia ([place]) (2010); and Anemia.   She has past surgical history that includes Cholecystectomy; Dilation and curettage of uterus; Breast surgery; and Dilatation & currettage/hysteroscopy with resectoscope (N/A, 10/27/2013).   Her  family history is not on file.  She   reports that she has never smoked. She has never used smokeless tobacco. She reports that she drinks alcohol. She reports that she does not use illicit drugs.  Outpatient Prescriptions Prior to Visit  Medication Sig Dispense Refill  . 5-Hydroxytryptophan (5-HTP PO) Take 1 tablet by mouth daily.    . Amino Acids (L-CARNITINE PO) Take 1 tablet by mouth daily.    . dasatinib (SPRYCEL) 70 MG tablet Take 1 tablet (70 mg total) by mouth daily. 30 tablet 6  . hydrocodone-ibuprofen (VICOPROFEN) 5-200 MG per tablet Take 1 tablet by mouth every 6 (six) hours as needed for pain. 30 tablet 0  . ibuprofen (ADVIL,MOTRIN) 200 MG tablet Take 400-600 mg by mouth every 6 (six) hours as needed (pain).    . Magnesium 500 MG TABS Take 1 tablet by mouth daily.    . Melatonin 5 MG TABS Take 5 mg by mouth at bedtime as needed.    . Omega-3 Fatty Acids (FISH OIL PO) Take 1 capsule by mouth daily.    Marland Kitchen POTASSIUM CHLORIDE ER PO Take 1 tablet by mouth daily.     . traZODone (DESYREL) 50 MG tablet Take 1 tablet (50 mg total) by mouth at bedtime. 60 tablet 3   No facility-administered medications prior to visit.    Social History   Social History  . Marital Status: Married    Spouse Name: N/A  . Number of Children: N/A  . Years of Education: N/A   Social History Main Topics  . Smoking status: Never Smoker   . Smokeless tobacco: Never Used  . Alcohol Use: Yes     Comment: social  . Drug Use: No  . Sexual Activity: Yes    Birth Control/ Protection: None   Other Topics Concern  . None   Social History Narrative     Review of Systems  Constitutional: Positive for fever and chills. Negative for appetite change.  HENT: Positive for congestion, ear pain, postnasal drip, rhinorrhea, sinus pressure and sore throat.   Eyes: Negative for pain and redness.  Respiratory: Positive for cough. Negative for shortness of breath and wheezing.   Cardiovascular: Negative for leg swelling.  Gastrointestinal: Negative for nausea, vomiting, abdominal pain, diarrhea, constipation and blood in stool.  Endocrine: Negative for polyuria.  Genitourinary: Negative for dysuria, urgency, frequency and flank pain.  Musculoskeletal: Negative for gait problem.  Skin: Negative for rash.  Neurological: Negative for weakness and headaches.  Psychiatric/Behavioral: Negative for confusion and decreased concentration. The patient is not nervous/anxious.     Objective:  BP 130/80 mmHg  Pulse 119  Temp(Src) 98.8 F (37.1 C) (Oral)  Resp 17  Ht 5' 5.5" (1.664 m)  Wt 211 lb (95.709 kg)  BMI 34.57 kg/m2  SpO2 97%  LMP 09/22/2015  Physical Exam  Constitutional: She is oriented to person, place, and time. She appears well-developed and well-nourished. No distress.  HENT:  Head: Normocephalic and atraumatic.  Right Ear: External ear normal.  Left Ear: External ear normal.  Nose: Nose normal.  Eyes: Conjunctivae and EOM are normal. Pupils are equal, round, and  reactive to light. No scleral icterus.  Neck: Normal range of motion. Neck supple. No tracheal deviation present.  Cardiovascular: Normal rate, regular rhythm and normal heart sounds.   Pulmonary/Chest: Effort normal. No respiratory distress. She has no wheezes. She has no rales.  Abdominal: She exhibits no mass. There is no tenderness. There is no rebound and no guarding.  Musculoskeletal: She exhibits no edema.  Lymphadenopathy:    She has no cervical adenopathy.  Neurological: She is alert and oriented to person, place, and time. Coordination normal.  Skin: Skin is warm and dry. No rash noted.  Psychiatric: She has a normal mood and affect. Her behavior is normal.      Assessment & Plan:   Victoria [name] was seen today for ear pain, sore throat and nasal congestion.  Diagnoses and all orders for this visit:  Acute bronchitis, unspecified organism  Acute pansinusitis, recurrence not specified  Other orders -     amoxicillin-clavulanate (AUGMENTIN) 875-125 MG tablet; Take 1 tablet by mouth 2 (two) times daily. -     chlorpheniramine-HYDROcodone (TUSSIONEX PENNKINETIC ER) 10-8 MG/5ML SUER; Take 5 mLs by mouth 2 (two) times daily. -     pseudoephedrine-guaifenesin (MUCINEX D) 60-600 MG 12 hr tablet; Take 1 tablet by mouth every 12 (twelve) hours.  I am having Victoria [name] start on amoxicillin-clavulanate, chlorpheniramine-HYDROcodone, and pseudoephedrine-guaifenesin. I am also having her maintain her ibuprofen, Amino Acids (L-CARNITINE PO), Magnesium, POTASSIUM CHLORIDE ER PO, 5-Hydroxytryptophan (5-HTP PO), Omega-3 Fatty Acids (FISH OIL PO), Melatonin, hydrocodone-ibuprofen, traZODone, and dasatinib.  Meds ordered this encounter  Medications  . amoxicillin-clavulanate (AUGMENTIN) 875-125 MG tablet    Sig: Take 1 tablet by mouth 2 (two) times daily.    Dispense:  20 tablet    Refill:  0  . chlorpheniramine-HYDROcodone (TUSSIONEX PENNKINETIC ER) 10-8 MG/5ML SUER    Sig: Take 5 mLs by  mouth 2 (two) times daily.    Dispense:  60 mL    Refill:  0  . pseudoephedrine-guaifenesin (MUCINEX D) 60-600 MG 12 hr tablet    Sig: Take 1 tablet by mouth every 12 (twelve) hours.    Dispense:  18 tablet    Refill:  0    Appropriate red flag conditions were discussed with the patient as well as actions that should be taken.  Patient expressed his understanding.  Follow-up: Return if symptoms worsen or fail to improve.  Roselee Culver, MD

## 2015-10-06 NOTE — Patient Instructions (Signed)

## 2016-02-16 ENCOUNTER — Telehealth: Payer: Self-pay | Admitting: *Deleted

## 2016-02-16 ENCOUNTER — Other Ambulatory Visit: Payer: Self-pay | Admitting: *Deleted

## 2016-02-16 DIAGNOSIS — C9210 Chronic myeloid leukemia, BCR/ABL-positive, not having achieved remission: Secondary | ICD-10-CM

## 2016-02-16 MED ORDER — DASATINIB 70 MG PO TABS: 70.0000 mg | ORAL_TABLET | Freq: Every day | ORAL | Status: DC

## 2016-02-16 NOTE — Telephone Encounter (Signed)
Received a refill request for Sprycel from [facility]. Patient has not been seen in this office since May 2015. At that time she was supposed to make a 6 month follow up appointment. Two appointments were made, and patient was a no-show to each.  Called patient and left a message that a one time, one month supply of her Sprycel will be sent to [facility], but that she must schedule a new appointment to continue receiving prescriptions.   Will also send a message to scheduler.

## 2016-03-03 ENCOUNTER — Other Ambulatory Visit: Payer: Self-pay | Admitting: *Deleted

## 2016-03-03 DIAGNOSIS — C9210 Chronic myeloid leukemia, BCR/ABL-positive, not having achieved remission: Secondary | ICD-10-CM

## 2016-03-06 ENCOUNTER — Other Ambulatory Visit (HOSPITAL_BASED_OUTPATIENT_CLINIC_OR_DEPARTMENT_OTHER): Payer: BLUE CROSS/BLUE SHIELD

## 2016-03-06 ENCOUNTER — Ambulatory Visit (HOSPITAL_BASED_OUTPATIENT_CLINIC_OR_DEPARTMENT_OTHER): Payer: BLUE CROSS/BLUE SHIELD | Admitting: Hematology & Oncology

## 2016-03-06 ENCOUNTER — Encounter: Payer: Self-pay | Admitting: Hematology & Oncology

## 2016-03-06 VITALS — BP 158/94 | HR 85 | Temp 97.8°F | Resp 18 | Ht 65.5 in | Wt 204.0 lb

## 2016-03-06 DIAGNOSIS — C9210 Chronic myeloid leukemia, BCR/ABL-positive, not having achieved remission: Secondary | ICD-10-CM

## 2016-03-06 LAB — CMP (CANCER CENTER ONLY)
ALBUMIN: 4.1 g/dL (ref 3.3–5.5)
ALK PHOS: 54 U/L (ref 26–84)
ALT: 30 U/L (ref 10–47)
AST: 34 U/L (ref 11–38)
BILIRUBIN TOTAL: 0.8 mg/dL (ref 0.20–1.60)
BUN, Bld: 18 mg/dL (ref 7–22)
CALCIUM: 9.9 mg/dL (ref 8.0–10.3)
CO2: 30 mEq/L (ref 18–33)
Chloride: 100 mEq/L (ref 98–108)
Creat: 0.7 mg/dl (ref 0.6–1.2)
GLUCOSE: 109 mg/dL (ref 73–118)
Potassium: 4.2 mEq/L (ref 3.3–4.7)
Sodium: 138 mEq/L (ref 128–145)
Total Protein: 8.3 g/dL — ABNORMAL HIGH (ref 6.4–8.1)

## 2016-03-06 LAB — CBC WITH DIFFERENTIAL (CANCER CENTER ONLY)
BASO#: 0 10*3/uL (ref 0.0–0.2)
BASO%: 0.5 % (ref 0.0–2.0)
EOS%: 1.5 % (ref 0.0–7.0)
Eosinophils Absolute: 0.1 10*3/uL (ref 0.0–0.5)
HEMATOCRIT: 44.8 % (ref 34.8–46.6)
HEMOGLOBIN: 15.3 g/dL (ref 11.6–15.9)
LYMPH#: 1.9 10*3/uL (ref 0.9–3.3)
LYMPH%: 28.7 % (ref 14.0–48.0)
MCH: 30.1 pg (ref 26.0–34.0)
MCHC: 34.2 g/dL (ref 32.0–36.0)
MCV: 88 fL (ref 81–101)
MONO#: 0.4 10*3/uL (ref 0.1–0.9)
MONO%: 6.6 % (ref 0.0–13.0)
NEUT%: 62.7 % (ref 39.6–80.0)
NEUTROS ABS: 4.1 10*3/uL (ref 1.5–6.5)
Platelets: 211 10*3/uL (ref 145–400)
RBC: 5.08 10*6/uL (ref 3.70–5.32)
RDW: 14.5 % (ref 11.1–15.7)
WBC: 6.5 10*3/uL (ref 3.9–10.0)

## 2016-03-06 LAB — CHCC SATELLITE - SMEAR

## 2016-03-06 NOTE — Progress Notes (Signed)
Hematology and Oncology Follow Up Visit  TIMISHA MONDRY [id number] 10-09-1970 45 y.o. 03/06/2016   Principle Diagnosis:   Chronic myeloid leukemia- MMR  Current Therapy:    Sprycel 70 mg by mouth daily     Interim History:  Ms. Griffith is back for follow-up. We saw her 2 years ago area and since then, she has been doing quite well. She is not working. She helps manage a salon. Patient brings her 41-year-old daughter in. She is incredibly cute and it was very nice having her with [place].  Ms. Liggins is on blood pressure medication now. Some of the blood pressure elevation might be from the Sprycel.  She has had a little bit of leg swelling. This is always been an issue for her.  She seems to be sleeping a little bit better.  Her last BCR/ABL analysis was syrup 0.06%. This indicates a major molecular remission. She has been in a major molecular remission for several years.  She has had no problems with cough or shortness of breath. She's had no rashes. She's had no infections. She's had no change in bowel or bladder habits.  Her monthly cycles are very erratic. She says she may have 1 or 2 a year. She said her mother had the same thing.  Overall, her performance status is ECOG 0.  Medications:  Current outpatient prescriptions:  .  5-Hydroxytryptophan (5-HTP PO), Take 1 tablet by mouth daily., Disp: , Rfl:  .  Amino Acids (L-CARNITINE PO), Take 1 tablet by mouth daily., Disp: , Rfl:  .  amoxicillin-clavulanate (AUGMENTIN) 875-125 MG tablet, Take 1 tablet by mouth 2 (two) times daily., Disp: 20 tablet, Rfl: 0 .  chlorpheniramine-HYDROcodone (TUSSIONEX PENNKINETIC ER) 10-8 MG/5ML SUER, Take 5 mLs by mouth 2 (two) times daily., Disp: 60 mL, Rfl: 0 .  dasatinib (SPRYCEL) 70 MG tablet, Take 1 tablet (70 mg total) by mouth daily., Disp: 30 tablet, Rfl: 0 .  hydrochlorothiazide (HYDRODIURIL) 25 MG tablet, , Disp: , Rfl: 0 .  hydrocodone-ibuprofen (VICOPROFEN) 5-200 MG per tablet, Take 1  tablet by mouth every 6 (six) hours as needed for pain., Disp: 30 tablet, Rfl: 0 .  ibuprofen (ADVIL,MOTRIN) 200 MG tablet, Take 400-600 mg by mouth every 6 (six) hours as needed (pain)., Disp: , Rfl:  .  Magnesium 500 MG TABS, Take 1 tablet by mouth daily., Disp: , Rfl:  .  Melatonin 5 MG TABS, Take 5 mg by mouth at bedtime as needed., Disp: , Rfl:  .  Omega-3 Fatty Acids (FISH OIL PO), Take 1 capsule by mouth daily., Disp: , Rfl:  .  POTASSIUM CHLORIDE ER PO, Take 1 tablet by mouth daily., Disp: , Rfl:  .  pseudoephedrine-guaifenesin (MUCINEX D) 60-600 MG 12 hr tablet, Take 1 tablet by mouth every 12 (twelve) hours., Disp: 18 tablet, Rfl: 0 .  sertraline (ZOLOFT) 100 MG tablet, Take 150 mg by mouth., Disp: , Rfl:  .  traZODone (DESYREL) 50 MG tablet, Take 1 tablet (50 mg total) by mouth at bedtime., Disp: 60 tablet, Rfl: 3  Allergies: No Known Allergies  Past Medical History, Surgical history, Social history, and Family History were reviewed and updated.  Review of Systems: As above  Physical Exam:  height is 5' 5.5" (1.664 m) and weight is 204 lb (92.534 kg). Her temperature is 97.8 F (36.6 C). Her blood pressure is 158/94 and her pulse is 85. Her respiration is 18.   Wt Readings from Last 3 Encounters:  03/06/16 204  lb (92.534 kg)  10/06/15 211 lb (95.709 kg)  02/02/14 206 lb (93.441 kg)     Well-developed and well-nourished white female in no obvious distress. Head and neck exam shows no ocular or oral lesions. She has no palpable cervical or supraclavicular lymph nodes. Lungs are clear bilaterally. Cardiac exam regular rate and rhythm with a normal S1 and S2. Heart no murmurs, rubs or bruits. Abdomen is soft. She has good bowel sounds. She has no fluid wave. There is no palpable liver or spleen tip. Back exam shows no tenderness over the spine, ribs or hips. Extremities shows no clubbing, cyanosis or edema. Skin exam shows no rashes, ecchymoses or petechia. Neurological exam shows  no focal neurological deficits.  Lab Results  Component Value Date   WBC 6.5 03/06/2016   HGB 15.3 03/06/2016   HCT 44.8 03/06/2016   MCV 88 03/06/2016   PLT 211 03/06/2016     Chemistry      Component Value Date/Time   NA 138 03/06/2016 1109   NA 135 02/02/2014 1453   K 4.2 03/06/2016 1109   K 4.1 02/02/2014 1453   CL 100 03/06/2016 1109   CL 100 02/02/2014 1453   CO2 30 03/06/2016 1109   CO2 25 02/02/2014 1453   BUN 18 03/06/2016 1109   BUN 16 02/02/2014 1453   CREATININE 0.7 03/06/2016 1109   CREATININE 0.52 02/02/2014 1453      Component Value Date/Time   CALCIUM 9.9 03/06/2016 1109   CALCIUM 9.3 02/02/2014 1453   ALKPHOS 54 03/06/2016 1109   ALKPHOS 51 02/02/2014 1453   AST 34 03/06/2016 1109   AST 17 02/02/2014 1453   ALT 30 03/06/2016 1109   ALT 17 02/02/2014 1453   BILITOT 0.80 03/06/2016 1109   BILITOT 0.5 02/02/2014 1453         Impression and Plan: Ms. Quant is A 45 year old white female with chronic myeloid leukemia. We have been following her now for 9 years. She presented back in November 2008. She did not do well with Gleevec. Once she was on Sprycel, she has maintained a major molecular remission for over 5 years.  I think that we can probably get her off the Sprycel. With the latest information regarding discontinuing TKI therapy in patients with chronic myeloid leukemia, I think she would be a good candidate to get off treatment.  I told her that if we are going to get her off therapy, we had to see her back every 3 months to monitor the BCR/ABL levels. She understands this.  She is very excited to be off the Sprycel. Hopefully, this will help save her money.  I spent about 35 minutes with her today. I started her about the possibility of getting off Sprycel. Again, she is excited about this.     Volanda Napoleon, MD 6/12/20171:43 PM

## 2016-03-14 ENCOUNTER — Encounter: Payer: Self-pay | Admitting: Hematology & Oncology

## 2016-03-27 ENCOUNTER — Other Ambulatory Visit: Payer: Self-pay | Admitting: Nurse Practitioner

## 2016-03-27 DIAGNOSIS — C9210 Chronic myeloid leukemia, BCR/ABL-positive, not having achieved remission: Secondary | ICD-10-CM

## 2016-03-27 MED ORDER — DASATINIB 70 MG PO TABS: 70.0000 mg | ORAL_TABLET | Freq: Every day | ORAL | Status: DC

## 2016-04-12 ENCOUNTER — Telehealth: Payer: Self-pay

## 2016-04-12 NOTE — Telephone Encounter (Signed)
Received call from [facility] stating they haven't been able to reach pt for Spyrcel delivery. Confirmed with Dr Marin Olp that pt is no longer on this therapy. Mickel Baas at [facility] aware. dph

## 2016-06-06 ENCOUNTER — Ambulatory Visit: Payer: BLUE CROSS/BLUE SHIELD | Admitting: Hematology & Oncology

## 2016-06-06 ENCOUNTER — Other Ambulatory Visit: Payer: BLUE CROSS/BLUE SHIELD

## 2016-06-26 ENCOUNTER — Encounter: Payer: Self-pay | Admitting: Hematology & Oncology

## 2016-06-26 ENCOUNTER — Other Ambulatory Visit (HOSPITAL_BASED_OUTPATIENT_CLINIC_OR_DEPARTMENT_OTHER): Payer: BLUE CROSS/BLUE SHIELD

## 2016-06-26 ENCOUNTER — Ambulatory Visit (HOSPITAL_BASED_OUTPATIENT_CLINIC_OR_DEPARTMENT_OTHER): Payer: BLUE CROSS/BLUE SHIELD | Admitting: Hematology & Oncology

## 2016-06-26 DIAGNOSIS — C9212 Chronic myeloid leukemia, BCR/ABL-positive, in relapse: Secondary | ICD-10-CM

## 2016-06-26 DIAGNOSIS — C9210 Chronic myeloid leukemia, BCR/ABL-positive, not having achieved remission: Secondary | ICD-10-CM

## 2016-06-26 LAB — MANUAL DIFFERENTIAL (CHCC SATELLITE)
ALC: 4.6 10*3/uL — ABNORMAL HIGH (ref 0.6–2.2)
ANC (CHCC HP manual diff): 60.1 10*3/uL — ABNORMAL HIGH (ref 1.5–6.7)
BASO: 2 % (ref 0–2)
Band Neutrophils: 15 % — ABNORMAL HIGH (ref 0–10)
EOS: 1 % (ref 0–7)
LYMPH: 6 % — ABNORMAL LOW (ref 14–48)
METAMYELOCYTES PCT: 16 % — AB (ref 0–0)
MONO: 10 % (ref 0–13)
Myelocytes: 4 % — ABNORMAL HIGH (ref 0–0)
NRBC: 1 % — AB (ref 0–0)
PLT EST ~~LOC~~: ADEQUATE
PROMYELO: 2 % — AB (ref 0–0)
SEG: 44 % (ref 40–75)

## 2016-06-26 LAB — CMP (CANCER CENTER ONLY)
ALT: 42 U/L (ref 10–47)
AST: 49 U/L — ABNORMAL HIGH (ref 11–38)
Albumin: 3.7 g/dL (ref 3.3–5.5)
Alkaline Phosphatase: 88 U/L — ABNORMAL HIGH (ref 26–84)
BUN: 17 mg/dL (ref 7–22)
CO2: 29 mEq/L (ref 18–33)
Calcium: 9.4 mg/dL (ref 8.0–10.3)
Chloride: 102 mEq/L (ref 98–108)
Creat: 0.2 mg/dl — ABNORMAL LOW (ref 0.6–1.2)
GLUCOSE: 111 mg/dL (ref 73–118)
POTASSIUM: 3.7 meq/L (ref 3.3–4.7)
Sodium: 132 mEq/L (ref 128–145)
Total Bilirubin: 0.6 mg/dl (ref 0.20–1.60)
Total Protein: 7.8 g/dL (ref 6.4–8.1)

## 2016-06-26 LAB — CBC WITH DIFFERENTIAL (CANCER CENTER ONLY)
HCT: 36.9 % (ref 34.8–46.6)
HGB: 12.3 g/dL (ref 11.6–15.9)
MCH: 30.4 pg (ref 26.0–34.0)
MCHC: 33.3 g/dL (ref 32.0–36.0)
MCV: 91 fL (ref 81–101)
PLATELETS: 157 10*3/uL (ref 145–400)
RBC: 4.05 10*6/uL (ref 3.70–5.32)
RDW: 18.7 % — AB (ref 11.1–15.7)
WBC: 76 10*3/uL — AB (ref 3.9–10.0)

## 2016-06-26 LAB — CHCC SATELLITE - SMEAR

## 2016-06-26 MED ORDER — DASATINIB 70 MG PO TABS: 70.0000 mg | ORAL_TABLET | Freq: Every day | ORAL | 3 refills | Status: DC

## 2016-06-26 NOTE — Progress Notes (Signed)
Hematology and Oncology Follow Up Visit  Victoria [name] [id number] 08-18-1971 45 y.o. 06/26/2016   Principle Diagnosis:   Chronic myeloid leukemia- relapsed  Current Therapy:    Sprycel 70 mg by mouth daily     Interim History:  Victoria [name] is back for follow-up. She has not felt all that well. She has not felt well probably for about 3 or 4 weeks. She is worried that the [facility] has come back.  We had her off Sprycel probably for about 4 months. We last saw her, she was in a major molecular remission.  She has had no problems with rashes. She's had no swollen lymph nodes. She's had no change in bowel or bladder habits.  She is enjoying her 46-year-old. She is in kindergarten. They had a good summer together.  Her husband is doing okay.  She has had no fever. She has had no cough or shortness of breath.  She is still working. She will wants to try to exercise a little bit more.  Overall, her performance status is ECOG 0.  Medications:  Current Outpatient Prescriptions:  .  losartan-hydrochlorothiazide (HYZAAR) 50-12.5 MG tablet, , Disp: , Rfl: 0 .  sertraline (ZOLOFT) 100 MG tablet, Take 150 mg by mouth., Disp: , Rfl:   Allergies: No Known Allergies  Past Medical History, Surgical history, Social history, and Family History were reviewed and updated.  Review of Systems: As above  Physical Exam:  height is 5' 5.5" (1.664 m) and weight is 210 lb (95.3 kg). Her oral temperature is 98.1 F (36.7 C). Her blood pressure is 160/91 (abnormal) and her pulse is 90. Her respiration is 18.   Wt Readings from Last 3 Encounters:  06/26/16 210 lb (95.3 kg)  03/06/16 204 lb (92.5 kg)  10/06/15 211 lb (95.7 kg)     Well-developed and well-nourished white female in no obvious distress. Head and neck exam shows no ocular or oral lesions. She has no palpable cervical or supraclavicular lymph nodes. Lungs are clear bilaterally. Cardiac exam regular rate and rhythm with a normal S1 and  S2. Heart no murmurs, rubs or bruits. Abdomen is soft. She has good bowel sounds. She has no fluid wave. There is no palpable liver or spleen tip. Back exam shows no tenderness over the spine, ribs or hips. Extremities shows no clubbing, cyanosis or edema. Skin exam shows no rashes, ecchymoses or petechia. Neurological exam shows no focal neurological deficits.  Lab Results  Component Value Date   WBC 76.0 (HH) 06/26/2016   HGB 12.3 06/26/2016   HCT 36.9 06/26/2016   MCV 91 06/26/2016   PLT 157 06/26/2016     Chemistry      Component Value Date/Time   NA 132 06/26/2016 1110   K 3.7 06/26/2016 1110   CL 102 06/26/2016 1110   CO2 29 06/26/2016 1110   BUN 17 06/26/2016 1110   CREATININE 0.2 (L) 06/26/2016 1110      Component Value Date/Time   CALCIUM 9.4 06/26/2016 1110   ALKPHOS 88 (H) 06/26/2016 1110   AST 49 (H) 06/26/2016 1110   ALT 42 06/26/2016 1110   BILITOT 0.60 06/26/2016 1110         Impression and Plan: Victoria [name] is A 45 year old white female with chronic myeloid leukemia. We have been following her now for 9 years. She presented back in November 2008. She did not do well with Gleevec. Once she was on Sprycel, she has maintained a major molecular remission for  over 5 years.  Unfortunately, it is apparent that her CML has relapsed. I looked at her blood under the microscope. She definitely has some immature myeloid cells. I do not see any blasts.  We will get her back onto the Sprycel. This clearly worked for her.   She is very disappointed that she has to go back onto Sprycel. I feel bad for her myself.   From the studies, she should have a very good response to Sprycel.   Hopefully, we will be able to get the Sprycel paid for.   I would like to see her back in 6 weeks. I spent about 30 minutes with her today.    Volanda Napoleon, MD 10/2/201712:27 PM

## 2016-06-27 ENCOUNTER — Other Ambulatory Visit: Payer: Self-pay | Admitting: *Deleted

## 2016-06-27 DIAGNOSIS — C9210 Chronic myeloid leukemia, BCR/ABL-positive, not having achieved remission: Secondary | ICD-10-CM

## 2016-06-27 MED ORDER — DASATINIB 70 MG PO TABS: 70.0000 mg | ORAL_TABLET | Freq: Every day | ORAL | 3 refills | Status: DC

## 2016-08-07 ENCOUNTER — Other Ambulatory Visit: Payer: BLUE CROSS/BLUE SHIELD

## 2016-08-07 ENCOUNTER — Ambulatory Visit: Payer: BLUE CROSS/BLUE SHIELD | Admitting: Hematology & Oncology

## 2016-08-28 ENCOUNTER — Ambulatory Visit: Payer: BLUE CROSS/BLUE SHIELD | Admitting: Hematology & Oncology

## 2016-08-28 ENCOUNTER — Other Ambulatory Visit: Payer: BLUE CROSS/BLUE SHIELD

## 2016-10-10 ENCOUNTER — Other Ambulatory Visit: Payer: Self-pay

## 2016-10-10 DIAGNOSIS — C9210 Chronic myeloid leukemia, BCR/ABL-positive, not having achieved remission: Secondary | ICD-10-CM

## 2016-10-10 MED ORDER — DASATINIB 70 MG PO TABS: 70.0000 mg | ORAL_TABLET | Freq: Every day | ORAL | 3 refills | Status: DC

## 2017-05-22 ENCOUNTER — Other Ambulatory Visit: Payer: Self-pay | Admitting: *Deleted

## 2017-05-22 DIAGNOSIS — C9210 Chronic myeloid leukemia, BCR/ABL-positive, not having achieved remission: Secondary | ICD-10-CM

## 2017-05-22 MED ORDER — DASATINIB 70 MG PO TABS: 70.0000 mg | ORAL_TABLET | Freq: Every day | ORAL | 3 refills | Status: DC

## 2017-06-18 ENCOUNTER — Ambulatory Visit: Payer: BLUE CROSS/BLUE SHIELD | Admitting: Hematology & Oncology

## 2017-06-18 ENCOUNTER — Other Ambulatory Visit: Payer: BLUE CROSS/BLUE SHIELD

## 2017-08-10 ENCOUNTER — Other Ambulatory Visit: Payer: Self-pay | Admitting: *Deleted

## 2017-08-10 DIAGNOSIS — C9210 Chronic myeloid leukemia, BCR/ABL-positive, not having achieved remission: Secondary | ICD-10-CM

## 2017-08-13 ENCOUNTER — Other Ambulatory Visit: Payer: Self-pay

## 2017-08-13 ENCOUNTER — Ambulatory Visit: Payer: Self-pay | Admitting: Hematology & Oncology

## 2017-09-13 ENCOUNTER — Other Ambulatory Visit: Payer: Self-pay | Admitting: *Deleted

## 2017-09-13 DIAGNOSIS — C9210 Chronic myeloid leukemia, BCR/ABL-positive, not having achieved remission: Secondary | ICD-10-CM

## 2017-09-13 MED ORDER — DASATINIB 70 MG PO TABS: 70.0000 mg | ORAL_TABLET | Freq: Every day | ORAL | 3 refills | Status: DC

## 2018-05-13 ENCOUNTER — Inpatient Hospital Stay: Payer: Self-pay | Admitting: Hematology & Oncology

## 2018-05-13 ENCOUNTER — Inpatient Hospital Stay: Payer: Self-pay

## 2018-06-08 ENCOUNTER — Emergency Department (HOSPITAL_COMMUNITY)
Admission: EM | Admit: 2018-06-08 | Discharge: 2018-06-09 | Disposition: A | Discharge status: Home / Self Care | Payer: BLUE CROSS/BLUE SHIELD | Attending: Emergency Medicine | Admitting: Emergency Medicine

## 2018-06-08 ENCOUNTER — Encounter (HOSPITAL_COMMUNITY): Payer: Self-pay

## 2018-06-08 ENCOUNTER — Other Ambulatory Visit: Payer: Self-pay

## 2018-06-08 DIAGNOSIS — F329 Major depressive disorder, single episode, unspecified: Secondary | ICD-10-CM | POA: Diagnosis not present

## 2018-06-08 DIAGNOSIS — K5732 Diverticulitis of large intestine without perforation or abscess without bleeding: Secondary | ICD-10-CM | POA: Insufficient documentation

## 2018-06-08 DIAGNOSIS — F419 Anxiety disorder, unspecified: Secondary | ICD-10-CM | POA: Diagnosis not present

## 2018-06-08 DIAGNOSIS — K5792 Diverticulitis of intestine, part unspecified, without perforation or abscess without bleeding: Secondary | ICD-10-CM

## 2018-06-08 DIAGNOSIS — M7918 Myalgia, other site: Secondary | ICD-10-CM | POA: Diagnosis present

## 2018-06-08 DIAGNOSIS — Z79899 Other long term (current) drug therapy: Secondary | ICD-10-CM | POA: Diagnosis not present

## 2018-06-08 DIAGNOSIS — Z856 Personal history of leukemia: Secondary | ICD-10-CM | POA: Insufficient documentation

## 2018-06-08 DIAGNOSIS — Z9049 Acquired absence of other specified parts of digestive tract: Secondary | ICD-10-CM | POA: Diagnosis not present

## 2018-06-08 MED ORDER — ONDANSETRON HCL 4 MG/2ML IJ SOLN
4.0000 mg | Freq: Once | INTRAMUSCULAR | Status: AC | PRN
  Administered 2018-06-09: 4 mg via INTRAVENOUS
  Filled 2018-06-08: qty 2

## 2018-06-08 NOTE — ED Triage Notes (Addendum)
Pt presents to ED from home for RLQ ABD pain and fever. Pt reports that she developed pain last night, and it got worse today. Pt reports fevers at home. +nausea. Pt has leukemia, taking oral chemo.

## 2018-06-08 NOTE — ED Notes (Signed)
Pt called 2x without an answer

## 2018-06-08 NOTE — ED Notes (Signed)
Bed: WA23 Expected date:  Expected time:  Means of arrival:  Comments: Triage 3  

## 2018-06-09 ENCOUNTER — Emergency Department (HOSPITAL_COMMUNITY): Payer: BLUE CROSS/BLUE SHIELD

## 2018-06-09 LAB — COMPREHENSIVE METABOLIC PANEL
ALT: 16 U/L (ref 0–44)
AST: 16 U/L (ref 15–41)
Albumin: 3.9 g/dL (ref 3.5–5.0)
Alkaline Phosphatase: 50 U/L (ref 38–126)
Anion gap: 12 (ref 5–15)
BILIRUBIN TOTAL: 0.7 mg/dL (ref 0.3–1.2)
BUN: 22 mg/dL — AB (ref 6–20)
CO2: 24 mmol/L (ref 22–32)
CREATININE: 0.65 mg/dL (ref 0.44–1.00)
Calcium: 9.2 mg/dL (ref 8.9–10.3)
Chloride: 104 mmol/L (ref 98–111)
GFR calc Af Amer: >60 mL/min (ref >60)
Glucose, Bld: 109 mg/dL — ABNORMAL HIGH (ref 70–99)
POTASSIUM: 3.5 mmol/L (ref 3.5–5.1)
Sodium: 140 mmol/L (ref 135–145)
TOTAL PROTEIN: 8.3 g/dL — AB (ref 6.5–8.1)

## 2018-06-09 LAB — CBC
HCT: 36.6 % (ref 36.0–46.0)
Hemoglobin: 12 g/dL (ref 12.0–15.0)
MCH: 29.2 pg (ref 26.0–34.0)
MCHC: 32.8 g/dL (ref 30.0–36.0)
MCV: 89.1 fL (ref 78.0–100.0)
Platelets: 213 10*3/uL (ref 150–400)
RBC: 4.11 MIL/uL (ref 3.87–5.11)
RDW: 13.6 % (ref 11.5–15.5)
WBC: 15.2 10*3/uL — AB (ref 4.0–10.5)

## 2018-06-09 LAB — DIFFERENTIAL
BASOS ABS: 0.3 10*3/uL — AB (ref 0.0–0.1)
BASOS PCT: 2 %
Band Neutrophils: 1 %
EOS PCT: 2 %
Eosinophils Absolute: 0.3 10*3/uL (ref 0.0–0.7)
LYMPHS ABS: 1.2 10*3/uL (ref 0.7–4.0)
Lymphocytes Relative: 8 %
MONOS PCT: 2 %
MYELOCYTES: 1 %
Metamyelocytes Relative: 1 %
Monocytes Absolute: 0.3 10*3/uL (ref 0.1–1.0)
NEUTROS PCT: 83 %
Neutro Abs: 13.1 10*3/uL — ABNORMAL HIGH (ref 1.7–7.7)

## 2018-06-09 LAB — URINALYSIS, ROUTINE W REFLEX MICROSCOPIC
Bilirubin Urine: NEGATIVE
Glucose, UA: NEGATIVE mg/dL
Hgb urine dipstick: NEGATIVE
Ketones, ur: 20 mg/dL — AB
NITRITE: NEGATIVE
PROTEIN: 30 mg/dL — AB
SPECIFIC GRAVITY, URINE: 1.033 — AB (ref 1.005–1.030)
WBC, UA: >50 WBC/hpf — ABNORMAL HIGH (ref 0–5)
pH: 5 (ref 5.0–8.0)

## 2018-06-09 LAB — I-STAT BETA HCG BLOOD, ED (MC, WL, AP ONLY)

## 2018-06-09 LAB — LIPASE, BLOOD: Lipase: 49 U/L (ref 11–51)

## 2018-06-09 MED ORDER — ONDANSETRON 4 MG PO TBDP: 4.0000 mg | ORAL_TABLET | Freq: Three times a day (TID) | ORAL | 0 refills | Status: DC | PRN

## 2018-06-09 MED ORDER — SODIUM CHLORIDE 0.9 % IV BOLUS
1000.0000 mL | Freq: Once | INTRAVENOUS | Status: AC
  Administered 2018-06-09: 1000 mL via INTRAVENOUS

## 2018-06-09 MED ORDER — SODIUM CHLORIDE 0.9 % IJ SOLN
INTRAMUSCULAR | Status: AC
  Filled 2018-06-09: qty 50

## 2018-06-09 MED ORDER — SODIUM CHLORIDE 0.9 % IV SOLN
2.0000 g | Freq: Once | INTRAVENOUS | Status: AC
  Administered 2018-06-09: 2 g via INTRAVENOUS
  Filled 2018-06-09: qty 20

## 2018-06-09 MED ORDER — IOPAMIDOL (ISOVUE-300) INJECTION 61%
INTRAVENOUS | Status: AC
  Filled 2018-06-09: qty 100

## 2018-06-09 MED ORDER — CIPROFLOXACIN HCL 500 MG PO TABS: 500.0000 mg | ORAL_TABLET | Freq: Two times a day (BID) | ORAL | 0 refills | Status: DC

## 2018-06-09 MED ORDER — METRONIDAZOLE 500 MG PO TABS: 500.0000 mg | ORAL_TABLET | Freq: Three times a day (TID) | ORAL | 0 refills | Status: DC

## 2018-06-09 MED ORDER — IOPAMIDOL (ISOVUE-300) INJECTION 61%
100.0000 mL | Freq: Once | INTRAVENOUS | Status: AC | PRN
  Administered 2018-06-09: 100 mL via INTRAVENOUS

## 2018-06-09 MED ORDER — METRONIDAZOLE IN NACL 5-0.79 MG/ML-% IV SOLN
500.0000 mg | Freq: Once | INTRAVENOUS | Status: AC
  Administered 2018-06-09: 500 mg via INTRAVENOUS
  Filled 2018-06-09: qty 100

## 2018-06-09 MED ORDER — HYDROCODONE-ACETAMINOPHEN 5-325 MG PO TABS: 1.0000 | ORAL_TABLET | Freq: Four times a day (QID) | ORAL | 0 refills | Status: DC | PRN

## 2018-06-09 MED ORDER — FENTANYL CITRATE (PF) 100 MCG/2ML IJ SOLN
50.0000 ug | Freq: Once | INTRAMUSCULAR | Status: AC
  Administered 2018-06-09: 50 ug via INTRAVENOUS
  Filled 2018-06-09: qty 2

## 2018-06-09 NOTE — Discharge Instructions (Addendum)
Stay on a liquid diet the next 24-48 hrs then start a bland diet such as toast, crackers, [employer] chicken noodle soup. Advance your diet slowly back to normal. Once you are better, try to eat a high fiber diet. Call your doctors office on Monday, September 16 to let them know about your ED visit. They will probably want to recheck you this week. Return to the ED if your symptoms are getting worse or you are not improving over the next 48 hrs.

## 2018-06-09 NOTE — ED Provider Notes (Signed)
[facility] DEPT Provider Note   CSN: [id number] Arrival date & time: 06/08/18  2124  Time seen 12:40 AM   History   Chief Complaint Chief Complaint  Patient presents with  . Abdominal Pain  . Fever    HPI Victoria [name] is a 47 y.o. female.  HPI patient states during the night on September 12 she started feeling achy.  She states the following day she went to work however about 3 PM she started having some generalized mid abdominal pain that she described as a soreness and having diaphoresis.  She states the pain got worse that evening.  She had some dull abdominal discomfort all day yesterday, September 14.  She states the pain was worse when she walked her cough and it felt better if she bent over a little bit such as a sitting position.  She has had nausea without vomiting, undocumented fever, chills during the night, without dysuria or frequency.  She has had loss of appetite today.  She last ate 5 to 6 hours ago.  She states she is never had this pain before.  She is status post cholecystectomy.  Patient has a history of CML.  PCP Dr [facility] Marin Olp, Rudell Cobb, MD    Past Medical History:  Diagnosis Date  . Anemia    HX  . Anxiety    no meds  . Chronic myelogenous leukemia ([place]) 2010  . Depression    no meds  . PONV (postoperative nausea and vomiting)     Patient Active Problem List   Diagnosis Date Noted  . CML (chronic myelocytic leukemia) ([place]) 11/27/2011    Past Surgical History:  Procedure Laterality Date  . BREAST SURGERY     reduction  . CHOLECYSTECTOMY    . DILATATION & CURRETTAGE/HYSTEROSCOPY WITH RESECTOCOPE N/A 10/27/2013   Procedure: DILATATION & CURETTAGE/HYSTEROSCOPY WITH RESECTOCOPE;  Surgeon: Princess Bruins, MD;  Location: [facility];  Service: Gynecology;  Laterality: N/A;  . DILATION AND CURETTAGE OF UTERUS     TAB     OB History   None      Home Medications    Prior to Admission medications    Medication Sig Start Date End Date Taking? Authorizing Provider  dasatinib (SPRYCEL) 70 MG tablet Take 1 tablet (70 mg total) by mouth daily. 09/13/17  Yes Ennever, Rudell Cobb, MD  losartan-hydrochlorothiazide (HYZAAR) 100-12.5 MG tablet Take 1 tablet by mouth daily. 04/18/18  Yes [provider]  naproxen sodium (ALEVE) 220 MG tablet Take 440 mg by mouth 2 (two) times daily as needed (pain).   Yes [provider]  sertraline (ZOLOFT) 100 MG tablet Take 200 mg by mouth daily after breakfast.    Yes [provider]  ciprofloxacin (CIPRO) 500 MG tablet Take 1 tablet (500 mg total) by mouth 2 (two) times daily. 06/09/18   Rolland Porter, MD  HYDROcodone-acetaminophen (NORCO/VICODIN) 5-325 MG tablet Take 1 tablet by mouth every 6 (six) hours as needed for moderate pain or severe pain. 06/09/18   Rolland Porter, MD  metroNIDAZOLE (FLAGYL) 500 MG tablet Take 1 tablet (500 mg total) by mouth 3 (three) times daily. 06/09/18   Rolland Porter, MD  ondansetron (ZOFRAN ODT) 4 MG disintegrating tablet Take 1 tablet (4 mg total) by mouth every 8 (eight) hours as needed for nausea or vomiting. 06/09/18   Rolland Porter, MD    Family History History reviewed. No pertinent family history.  Social History Social History   Tobacco Use  .  Smoking status: Never Smoker  . Smokeless tobacco: Never Used  Substance Use Topics  . Alcohol use: Yes    Alcohol/week: 0.0 standard drinks    Comment: social  . Drug use: No     Allergies   Patient has no known allergies.   Review of Systems Review of Systems  All other systems reviewed and are negative.    Physical Exam Updated Vital Signs BP 121/79   Pulse 73   Temp 97.9 F (36.6 C)   Resp 18   Ht 5\' 5"  (1.651 m)   Wt 86.2 kg   SpO2 100%   BMI 31.62 kg/m   Vital signs normal    Physical Exam  Constitutional: She is oriented to person, place, and time. She appears well-developed and well-nourished.  Non-toxic appearance. She does not  appear ill. No distress.  HENT:  Head: Normocephalic and atraumatic.  Right Ear: External ear normal.  Left Ear: External ear normal.  Nose: Nose normal. No mucosal edema or rhinorrhea.  Mouth/Throat: Oropharynx is clear and moist and mucous membranes are normal. No dental abscesses or uvula swelling.  Eyes: Pupils are equal, round, and reactive to light. Conjunctivae and EOM are normal.  Neck: Normal range of motion and full passive range of motion without pain. Neck supple.  Cardiovascular: Normal rate, regular rhythm and normal heart sounds. Exam reveals no gallop and no friction rub.  No murmur heard. Patient has generalized abdominal tenderness and states where I palpate causes radiation of pain towards her lower abdomen, Rovsing sign.  She is most tender around her right lower quadrant.  There is no guarding or rebound.  Pulmonary/Chest: Effort normal and breath sounds normal. No respiratory distress. She has no wheezes. She has no rhonchi. She has no rales. She exhibits no tenderness and no crepitus.  Abdominal: Soft. Normal appearance and bowel sounds are normal. She exhibits no distension. There is tenderness. There is no rebound and no guarding.  Musculoskeletal: Normal range of motion. She exhibits no edema or tenderness.  Moves all extremities well.   Neurological: She is alert and oriented to person, place, and time. She has normal strength. No cranial nerve deficit.  Skin: Skin is warm, dry and intact. No rash noted. No erythema. No pallor.  Psychiatric: She has a normal mood and affect. Her speech is normal and behavior is normal. Her mood appears not anxious.  Nursing note and vitals reviewed.    ED Treatments / Results  Labs (all labs ordered are listed, but only abnormal results are displayed) Results for orders placed or performed during the hospital encounter of 06/08/18  Lipase, blood  Result Value Ref Range   Lipase 49 11 - 51 U/L  Comprehensive metabolic panel    Result Value Ref Range   Sodium 140 135 - 145 mmol/L   Potassium 3.5 3.5 - 5.1 mmol/L   Chloride 104 98 - 111 mmol/L   CO2 24 22 - 32 mmol/L   Glucose, Bld 109 (H) 70 - 99 mg/dL   BUN 22 (H) 6 - 20 mg/dL   Creatinine, Ser 0.65 0.44 - 1.00 mg/dL   Calcium 9.2 8.9 - 10.3 mg/dL   Total Protein 8.3 (H) 6.5 - 8.1 g/dL   Albumin 3.9 3.5 - 5.0 g/dL   AST 16 15 - 41 U/L   ALT 16 0 - 44 U/L   Alkaline Phosphatase 50 38 - 126 U/L   Total Bilirubin 0.7 0.3 - 1.2 mg/dL   GFR calc  non Af Amer >60 >60 mL/min   GFR calc Af Amer >60 >60 mL/min   Anion gap 12 5 - 15  CBC  Result Value Ref Range   WBC 15.2 (H) 4.0 - 10.5 K/uL   RBC 4.11 3.87 - 5.11 MIL/uL   Hemoglobin 12.0 12.0 - 15.0 g/dL   HCT 36.6 36.0 - 46.0 %   MCV 89.1 78.0 - 100.0 fL   MCH 29.2 26.0 - 34.0 pg   MCHC 32.8 30.0 - 36.0 g/dL   RDW 13.6 11.5 - 15.5 %   Platelets 213 150 - 400 K/uL  Urinalysis, Routine w reflex microscopic  Result Value Ref Range   Color, Urine YELLOW YELLOW   APPearance HAZY (A) CLEAR   Specific Gravity, Urine 1.033 (H) 1.005 - 1.030   pH 5.0 5.0 - 8.0   Glucose, UA NEGATIVE NEGATIVE mg/dL   Hgb urine dipstick NEGATIVE NEGATIVE   Bilirubin Urine NEGATIVE NEGATIVE   Ketones, ur 20 (A) NEGATIVE mg/dL   Protein, ur 30 (A) NEGATIVE mg/dL   Nitrite NEGATIVE NEGATIVE   Leukocytes, UA LARGE (A) NEGATIVE   RBC / HPF 6-10 0 - 5 RBC/hpf   WBC, UA >50 (H) 0 - 5 WBC/hpf   Bacteria, UA RARE (A) NONE SEEN   Squamous Epithelial / LPF 0-5 0 - 5   Mucus PRESENT    Ca Oxalate Crys, UA PRESENT   I-Stat beta hCG blood, ED  Result Value Ref Range   I-stat hCG, quantitative <5.0 <5 mIU/mL   Comment 3           Laboratory interpretation all normal except leukocytosis, questionable UTI, concentrated urine consistent with dehydration urine culture sent, leukocytosis    EKG None  Radiology Ct Abdomen Pelvis W Contrast  Result Date: 06/09/2018 CLINICAL DATA:  Right lower quadrant abdominal pain and fever.  History of leukemia. Taking oral chemotherapy. EXAM: CT ABDOMEN AND PELVIS WITH CONTRAST TECHNIQUE: Multidetector CT imaging of the abdomen and pelvis was performed using the standard protocol following bolus administration of intravenous contrast. CONTRAST:  120mL ISOVUE-300 IOPAMIDOL (ISOVUE-300) INJECTION 61% COMPARISON:  08/23/2007 splenic ultrasound. FINDINGS: Lower chest: Clear lung bases. Normal heart size without pericardial or pleural effusion. Hepatobiliary: Variant lateral segment left liver lobe extending in the left upper quadrant. Cholecystectomy. The common duct is upper normal to mildly dilated at 12 mm on coronal image 44. Tapers in the region of the pancreatic head. Pancreas: Normal, without mass or ductal dilatation. Spleen: Splenic size upper normal, 12.7 cm craniocaudal. Adrenals/Urinary Tract: Normal adrenal glands. Normal kidneys, without hydronephrosis. Normal urinary bladder. Stomach/Bowel: Gastric antral underdistention. Scattered colonic diverticula. Moderate inflammation surrounds the proximal transverse colon, which demonstrates wall thickening and extensive surrounding diverticula. Example image 56/2 and image 33 coronal. Although there is no free perforation, there are locules of air which are positioned significantly cephalad to the colonic lumen, including on image 51/2. No pericolonic fluid collection. Normal terminal ileum and appendix. Normal small bowel. Vascular/Lymphatic: Normal caliber of the aorta and branch vessels. No abdominopelvic adenopathy. Reproductive: Retroverted uterus with a posterior body 3.1 cm fibroid. No adnexal mass. Other: No significant free fluid. Musculoskeletal: Presumed bone island in the right acetabulum. Advanced degenerative disc disease at the L4-5 level. IMPRESSION: 1. Moderate pericolonic edema surrounding the proximal transverse colon, favored to represent advanced diverticulitis. No pericolonic fluid collection. 2. Normal appendix. 3. No  abdominopelvic adenopathy. 4. Uterine fibroid. 5. Cholecystectomy with borderline to mild common duct dilatation. No cause identified. Consider correlation  with bilirubin level. Electronically Signed   By: Abigail Miyamoto M.D.   On: 06/09/2018 01:31    Procedures Procedures (including critical care time)  Medications Ordered in ED Medications  iopamidol (ISOVUE-300) 61 % injection (has no administration in time range)  sodium chloride 0.9 % injection (has no administration in time range)  cefTRIAXone (ROCEPHIN) 2 g in sodium chloride 0.9 % 100 mL IVPB (2 g Intravenous New Bag/Given 06/09/18 0233)    And  metroNIDAZOLE (FLAGYL) IVPB 500 mg (has no administration in time range)  ondansetron (ZOFRAN) injection 4 mg (4 mg Intravenous Given 06/09/18 0034)  sodium chloride 0.9 % bolus 1,000 mL (1,000 mLs Intravenous New Bag/Given 06/09/18 0231)  sodium chloride 0.9 % bolus 1,000 mL (0 mLs Intravenous Stopped 06/09/18 0229)  fentaNYL (SUBLIMAZE) injection 50 mcg (50 mcg Intravenous Given 06/09/18 0138)  iopamidol (ISOVUE-300) 61 % injection 100 mL (100 mLs Intravenous Contrast Given 06/09/18 0105)     Initial Impression / Assessment and Plan / ED Course  I have reviewed the triage vital signs and the nursing notes.  Pertinent labs & imaging results that were available during my care of the patient were reviewed by me and considered in my medical decision making (see chart for details).    On exam patient appears to be dehydrated, she was given IV fluids, IV pain and nausea medication.  Patient symptoms were concerning for acute appendicitis, CT of the abdomen/pelvis was ordered.  After reviewing her CT scan patient was started on antibiotics for colitis, Rocephin and Flagyl.  I discussed her CT results with the patient.  She would like to try to go home and be treated as an outpatient.  Review of the [place] shows patient has had no prescriptions in the past 2 years.  Final  Clinical Impressions(s) / ED Diagnoses   Final diagnoses:  Diverticulitis    ED Discharge Orders         Ordered    ciprofloxacin (CIPRO) 500 MG tablet  2 times daily     06/09/18 0357    metroNIDAZOLE (FLAGYL) 500 MG tablet  3 times daily     06/09/18 0357    HYDROcodone-acetaminophen (NORCO/VICODIN) 5-325 MG tablet  Every 6 hours PRN     06/09/18 0357    ondansetron (ZOFRAN ODT) 4 MG disintegrating tablet  Every 8 hours PRN     06/09/18 0357         Plan discharge  Rolland Porter, MD, Barbette Or, MD 06/09/18 [phone]

## 2018-06-09 NOTE — ED Notes (Signed)
Patient transported to CT 

## 2018-06-09 NOTE — ED Notes (Signed)
Discharge paperwork reviewed with pt. Pt verbalized understanding. Pt to pick up prescriptions from pharmacy. Pt to follow up with PCP. PIV removed. VSS. Pt ambulatory to waiting room.

## 2018-06-12 LAB — URINE CULTURE: Special Requests: NORMAL

## 2018-06-17 ENCOUNTER — Inpatient Hospital Stay: Payer: BLUE CROSS/BLUE SHIELD

## 2018-06-17 ENCOUNTER — Inpatient Hospital Stay: Payer: BLUE CROSS/BLUE SHIELD | Admitting: Hematology & Oncology

## 2018-07-22 ENCOUNTER — Other Ambulatory Visit: Payer: Self-pay | Admitting: *Deleted

## 2018-07-22 ENCOUNTER — Inpatient Hospital Stay (HOSPITAL_BASED_OUTPATIENT_CLINIC_OR_DEPARTMENT_OTHER): Payer: BLUE CROSS/BLUE SHIELD | Admitting: Hematology & Oncology

## 2018-07-22 ENCOUNTER — Encounter: Payer: Self-pay | Admitting: Hematology & Oncology

## 2018-07-22 ENCOUNTER — Other Ambulatory Visit: Payer: Self-pay

## 2018-07-22 ENCOUNTER — Inpatient Hospital Stay: Payer: BLUE CROSS/BLUE SHIELD | Attending: Hematology & Oncology

## 2018-07-22 VITALS — BP 118/69 | HR 69 | Temp 98.5°F | Resp 18 | Wt 189.0 lb

## 2018-07-22 DIAGNOSIS — C9210 Chronic myeloid leukemia, BCR/ABL-positive, not having achieved remission: Secondary | ICD-10-CM | POA: Insufficient documentation

## 2018-07-22 DIAGNOSIS — Z79899 Other long term (current) drug therapy: Secondary | ICD-10-CM

## 2018-07-22 LAB — CMP (CANCER CENTER ONLY)
ALBUMIN: 4.2 g/dL (ref 3.5–5.0)
ALK PHOS: 51 U/L (ref 38–126)
ALT: 33 U/L (ref 0–44)
ANION GAP: 9 (ref 5–15)
AST: 29 U/L (ref 15–41)
BUN: 22 mg/dL — ABNORMAL HIGH (ref 6–20)
CALCIUM: 9.7 mg/dL (ref 8.9–10.3)
CO2: 27 mmol/L (ref 22–32)
Chloride: 103 mmol/L (ref 98–111)
Creatinine: 0.75 mg/dL (ref 0.44–1.00)
Glucose, Bld: 100 mg/dL — ABNORMAL HIGH (ref 70–99)
Potassium: 3.9 mmol/L (ref 3.5–5.1)
SODIUM: 139 mmol/L (ref 135–145)
TOTAL PROTEIN: 8.2 g/dL — AB (ref 6.5–8.1)
Total Bilirubin: 0.5 mg/dL (ref 0.3–1.2)

## 2018-07-22 LAB — CBC WITH DIFFERENTIAL (CANCER CENTER ONLY)
Abs Immature Granulocytes: 0.12 10*3/uL — ABNORMAL HIGH (ref 0.00–0.07)
BASOS ABS: 0.1 10*3/uL (ref 0.0–0.1)
Basophils Relative: 1 %
EOS ABS: 0.2 10*3/uL (ref 0.0–0.5)
EOS PCT: 2 %
HCT: 41.7 % (ref 36.0–46.0)
Hemoglobin: 13.4 g/dL (ref 12.0–15.0)
Immature Granulocytes: 1 %
Lymphocytes Relative: 28 %
Lymphs Abs: 2.6 10*3/uL (ref 0.7–4.0)
MCH: 28.3 pg (ref 26.0–34.0)
MCHC: 32.1 g/dL (ref 30.0–36.0)
MCV: 88.2 fL (ref 80.0–100.0)
Monocytes Absolute: 0.5 10*3/uL (ref 0.1–1.0)
Monocytes Relative: 6 %
NRBC: 0 % (ref 0.0–0.2)
Neutro Abs: 5.6 10*3/uL (ref 1.7–7.7)
Neutrophils Relative %: 62 %
Platelet Count: 233 10*3/uL (ref 150–400)
RBC: 4.73 MIL/uL (ref 3.87–5.11)
RDW: 14.7 % (ref 11.5–15.5)
WBC: 9 10*3/uL (ref 4.0–10.5)

## 2018-07-22 LAB — LACTATE DEHYDROGENASE: LDH: 182 U/L (ref 98–192)

## 2018-07-22 MED ORDER — DASATINIB 70 MG PO TABS: 70.0000 mg | ORAL_TABLET | Freq: Every day | ORAL | 3 refills | Status: DC

## 2018-07-22 NOTE — Progress Notes (Signed)
Hematology and Oncology Follow Up Visit  Victoria [name] [id number] September 14, 1971 47 y.o. 07/22/2018   Principle Diagnosis:   Chronic Myeloid Leukemia -- Relapsed  Current Therapy:    Sprycel 70 mg po q day     Interim History:  Victoria [name] is back for a long awaited follow-up.  We last saw Victoria [name] back in October 2017.  I am not sure exactly what happened.  Victoria [name] has been doing okay.  I think Victoria [name] may have run out of the Sprycel.  When we last saw Victoria [name], Victoria [name] clearly had relapsed CML.  We got Victoria [name] back on Sprycel.  We will have to see what Victoria [name] BCR/ABL ratio is.  Victoria [name] is doing well.  Victoria [name] daughter is now 80 years old.  I am absolutely amazed by this.  Victoria [name] has had no problems with nausea or vomiting.  Victoria [name] did have a bout of diverticulitis a few weeks ago.  Victoria [name] was hospitalized for this.  There is been no rashes.  Victoria [name] is had no leg swelling.  Victoria [name] has had no fever.  There is no cough or shortness of breath.  Overall, Victoria [name] performance status is ECOG 0.  Medications:  Current Outpatient Medications:  .  dasatinib (SPRYCEL) 70 MG tablet, Take 1 tablet (70 mg total) by mouth daily., Disp: 30 tablet, Rfl: 3 .  losartan-hydrochlorothiazide (HYZAAR) 100-12.5 MG tablet, Take 1 tablet by mouth daily., Disp: , Rfl: 0 .  sertraline (ZOLOFT) 100 MG tablet, Take 200 mg by mouth daily after breakfast. , Disp: , Rfl:   Allergies: No Known Allergies  Past Medical History, Surgical history, Social history, and Family History were reviewed and updated.  Review of Systems: Review of Systems  Constitutional: Negative.   HENT:  Negative.   Eyes: Negative.   Respiratory: Negative.   Cardiovascular: Negative.   Gastrointestinal: Negative.   Endocrine: Negative.   Genitourinary: Negative.    Musculoskeletal: Negative.   Skin: Negative.   Neurological: Negative.   Hematological: Negative.   Psychiatric/Behavioral: Negative.     Physical Exam:  weight is 189 lb (85.7 kg). Victoria [name] oral temperature is 98.5 F  (36.9 C). Victoria [name] blood pressure is 118/69 and Victoria [name] pulse is 69. Victoria [name] respiration is 18 and oxygen saturation is 100%.   Wt Readings from Last 3 Encounters:  07/22/18 189 lb (85.7 kg)  06/08/18 190 lb (86.2 kg)  06/26/16 210 lb (95.3 kg)    Physical Exam  Constitutional: Victoria [name] is oriented to person, place, and time.  HENT:  Head: Normocephalic and atraumatic.  Mouth/Throat: Oropharynx is clear and moist.  Eyes: Pupils are equal, round, and reactive to light. EOM are normal.  Neck: Normal range of motion.  Cardiovascular: Normal rate, regular rhythm and normal heart sounds.  Pulmonary/Chest: Effort normal and breath sounds normal.  Abdominal: Soft. Bowel sounds are normal.  Musculoskeletal: Normal range of motion. Victoria [name] exhibits no edema, tenderness or deformity.  Lymphadenopathy:    Victoria [name] has no cervical adenopathy.  Neurological: Victoria [name] is alert and oriented to person, place, and time.  Skin: Skin is warm and dry. No rash noted. No erythema.  Psychiatric: Victoria [name] has a normal mood and affect. Victoria [name] behavior is normal. Judgment and thought content normal.  Vitals reviewed.    Lab Results  Component Value Date   WBC 9.0 07/22/2018   HGB 13.4 07/22/2018   HCT 41.7 07/22/2018   MCV 88.2 07/22/2018   PLT 233 07/22/2018     Chemistry      Component Value  Date/Time   NA 140 06/09/2018 0006   NA 132 06/26/2016 1110   K 3.5 06/09/2018 0006   K 3.7 06/26/2016 1110   CL 104 06/09/2018 0006   CL 102 06/26/2016 1110   CO2 24 06/09/2018 0006   CO2 29 06/26/2016 1110   BUN 22 (H) 06/09/2018 0006   BUN 17 06/26/2016 1110   CREATININE 0.65 06/09/2018 0006   CREATININE 0.2 (L) 06/26/2016 1110      Component Value Date/Time   CALCIUM 9.2 06/09/2018 0006   CALCIUM 9.4 06/26/2016 1110   ALKPHOS 50 06/09/2018 0006   ALKPHOS 88 (H) 06/26/2016 1110   AST 16 06/09/2018 0006   AST 49 (H) 06/26/2016 1110   ALT 16 06/09/2018 0006   ALT 42 06/26/2016 1110   BILITOT 0.7 06/09/2018 0006   BILITOT 0.60  06/26/2016 1110       Impression and Plan: Victoria [name] is a 47 year old white female with CML.  Victoria [name] has had a long history of CML.  I think Victoria [name] has had it for at least 10 years.  We had Victoria [name] off Sprycel.  We initially had Victoria [name] on [place] which Victoria [name] did not do well with at all.  We will continue Victoria [name] on the Sprycel.  We will see what Victoria [name] BCR/ABL ratio is.  I will then plan to see Victoria [name] back in another year.  Because of cost wise issues, Victoria [name] really would do best seen [place] yearly.   Volanda Napoleon, MD 10/28/20193:31 PM

## 2018-08-08 LAB — BCR/ABL

## 2019-02-05 ENCOUNTER — Other Ambulatory Visit: Payer: Self-pay | Admitting: *Deleted

## 2019-02-05 DIAGNOSIS — C9210 Chronic myeloid leukemia, BCR/ABL-positive, not having achieved remission: Secondary | ICD-10-CM

## 2019-02-05 MED ORDER — DASATINIB 70 MG PO TABS: 70.0000 mg | ORAL_TABLET | Freq: Every day | ORAL | 3 refills | Status: DC

## 2019-05-29 ENCOUNTER — Telehealth: Payer: Self-pay | Admitting: *Deleted

## 2019-05-29 NOTE — Telephone Encounter (Signed)
Message left for patient to call office back regarding prescription for Sprycel.

## 2019-05-29 NOTE — Telephone Encounter (Signed)
Call received from [facility] by [employer] to inform Dr. Marin Olp that pt has not filled Sprycel and would like to know if patient is still on it.  Informed [facility] that pt should still be on Sprycel and that I will call patient to check on her status of filling Sprycel. Marland Kitchen

## 2019-07-23 ENCOUNTER — Inpatient Hospital Stay: Payer: BC Managed Care – PPO

## 2019-07-23 ENCOUNTER — Inpatient Hospital Stay: Payer: BC Managed Care – PPO | Admitting: Hematology & Oncology

## 2019-07-23 ENCOUNTER — Other Ambulatory Visit: Payer: Self-pay | Admitting: Hematology & Oncology

## 2019-07-23 DIAGNOSIS — C9210 Chronic myeloid leukemia, BCR/ABL-positive, not having achieved remission: Secondary | ICD-10-CM

## 2019-08-20 ENCOUNTER — Telehealth: Payer: Self-pay | Admitting: *Deleted

## 2019-08-20 NOTE — Telephone Encounter (Addendum)
Message received from [facility] stating that if they do not hear from patient within 48 hours, prescription for Sprycel will be discontinued.

## 2019-08-20 NOTE — Telephone Encounter (Signed)
Call placed to patient and message left for pt to call office back regarding Sprycel prescription.

## 2019-09-12 ENCOUNTER — Other Ambulatory Visit: Payer: Self-pay | Admitting: *Deleted

## 2019-09-12 DIAGNOSIS — C9210 Chronic myeloid leukemia, BCR/ABL-positive, not having achieved remission: Secondary | ICD-10-CM

## 2019-09-15 ENCOUNTER — Inpatient Hospital Stay: Payer: BC Managed Care – PPO | Attending: Hematology & Oncology

## 2019-09-15 ENCOUNTER — Ambulatory Visit: Payer: BC Managed Care – PPO | Admitting: Hematology & Oncology

## 2019-10-13 ENCOUNTER — Telehealth: Payer: Self-pay | Admitting: Pharmacy Technician

## 2019-10-13 NOTE — Telephone Encounter (Signed)
Oral Oncology Patient Advocate Encounter  Received notification from [employer] that prior authorization for Sprycel is required.  PA submitted on CoverMyMeds Key [id number] Status is pending  Oral Oncology Clinic will continue to follow.  [place] Patient [place] Phone [phone] Fax [phone] 10/13/2019 1:47 PM

## 2019-10-15 ENCOUNTER — Other Ambulatory Visit: Payer: Self-pay | Admitting: *Deleted

## 2019-10-15 DIAGNOSIS — C9210 Chronic myeloid leukemia, BCR/ABL-positive, not having achieved remission: Secondary | ICD-10-CM

## 2019-10-15 MED ORDER — DASATINIB 70 MG PO TABS: 70.0000 mg | ORAL_TABLET | Freq: Every day | ORAL | 6 refills | Status: DC

## 2019-10-15 NOTE — Telephone Encounter (Signed)
Oral Oncology Patient Advocate Encounter  Prior Authorization for Sprycel has been approved.    PA# [id number] Effective dates: 10/14/2019 through 10/13/2020  I have informed [facility] of the approval.  Oral Oncology Clinic will continue to follow.   [place] Patient [place] Phone [phone] Fax [phone] 10/15/2019 12:44 PM

## 2020-01-16 ENCOUNTER — Inpatient Hospital Stay (HOSPITAL_BASED_OUTPATIENT_CLINIC_OR_DEPARTMENT_OTHER): Payer: 59 | Admitting: Hematology & Oncology

## 2020-01-16 ENCOUNTER — Other Ambulatory Visit: Payer: Self-pay

## 2020-01-16 ENCOUNTER — Encounter: Payer: Self-pay | Admitting: Hematology & Oncology

## 2020-01-16 ENCOUNTER — Inpatient Hospital Stay: Payer: 59 | Attending: Hematology & Oncology

## 2020-01-16 VITALS — BP 142/78 | HR 78 | Temp 97.8°F | Resp 18 | Ht 64.96 in | Wt 209.0 lb

## 2020-01-16 DIAGNOSIS — C9212 Chronic myeloid leukemia, BCR/ABL-positive, in relapse: Secondary | ICD-10-CM | POA: Diagnosis present

## 2020-01-16 DIAGNOSIS — Z79899 Other long term (current) drug therapy: Secondary | ICD-10-CM | POA: Diagnosis not present

## 2020-01-16 DIAGNOSIS — R197 Diarrhea, unspecified: Secondary | ICD-10-CM | POA: Insufficient documentation

## 2020-01-16 DIAGNOSIS — C9210 Chronic myeloid leukemia, BCR/ABL-positive, not having achieved remission: Secondary | ICD-10-CM | POA: Diagnosis not present

## 2020-01-16 LAB — LACTATE DEHYDROGENASE: LDH: 279 U/L — ABNORMAL HIGH (ref 98–192)

## 2020-01-16 LAB — COMPREHENSIVE METABOLIC PANEL
ALT: 31 U/L (ref 0–44)
AST: 32 U/L (ref 15–41)
Albumin: 4.6 g/dL (ref 3.5–5.0)
Alkaline Phosphatase: 56 U/L (ref 38–126)
Anion gap: 7 (ref 5–15)
BUN: 20 mg/dL (ref 6–20)
CO2: 26 mmol/L (ref 22–32)
Calcium: 9.8 mg/dL (ref 8.9–10.3)
Chloride: 104 mmol/L (ref 98–111)
Creatinine, Ser: 0.69 mg/dL (ref 0.44–1.00)
GFR calc Af Amer: >60 mL/min (ref >60)
GFR calc non Af Amer: >60 mL/min (ref >60)
Glucose, Bld: 123 mg/dL — ABNORMAL HIGH (ref 70–99)
Potassium: 4 mmol/L (ref 3.5–5.1)
Sodium: 137 mmol/L (ref 135–145)
Total Bilirubin: 0.4 mg/dL (ref 0.3–1.2)
Total Protein: 8 g/dL (ref 6.5–8.1)

## 2020-01-16 LAB — CBC WITH DIFFERENTIAL (CANCER CENTER ONLY)
Abs Immature Granulocytes: 1.33 10*3/uL — ABNORMAL HIGH (ref 0.00–0.07)
Basophils Absolute: 0.3 10*3/uL — ABNORMAL HIGH (ref 0.0–0.1)
Basophils Relative: 2 %
Eosinophils Absolute: 0.2 10*3/uL (ref 0.0–0.5)
Eosinophils Relative: 1 %
HCT: 42.2 % (ref 36.0–46.0)
Hemoglobin: 13.2 g/dL (ref 12.0–15.0)
Immature Granulocytes: 10 %
Lymphocytes Relative: 20 %
Lymphs Abs: 2.7 10*3/uL (ref 0.7–4.0)
MCH: 27.3 pg (ref 26.0–34.0)
MCHC: 31.3 g/dL (ref 30.0–36.0)
MCV: 87.2 fL (ref 80.0–100.0)
Monocytes Absolute: 0.9 10*3/uL (ref 0.1–1.0)
Monocytes Relative: 7 %
Neutro Abs: 8.3 10*3/uL — ABNORMAL HIGH (ref 1.7–7.7)
Neutrophils Relative %: 60 %
Platelet Count: 283 10*3/uL (ref 150–400)
RBC: 4.84 MIL/uL (ref 3.87–5.11)
RDW: 15.9 % — ABNORMAL HIGH (ref 11.5–15.5)
WBC Count: 13.8 10*3/uL — ABNORMAL HIGH (ref 4.0–10.5)
nRBC: 0 % (ref 0.0–0.2)

## 2020-01-16 NOTE — Progress Notes (Signed)
Hematology and Oncology Follow Up Visit  Victoria [name]:[id number] 30-Aug-1971 49 y.o. 01/16/2020   Principle Diagnosis:   Chronic Myeloid Leukemia -- Relapsed  Current Therapy:    Sprycel 70 mg po q day     Interim History:  Victoria [name] is back for a long awaited follow-up.  As expected, there is issues that she has that she needs for [place] to help her out with.  I think the big issue is trying to get the Sprycel for her.  She has had problems getting this from insurance.  There have been changes with the employment status of she and her husband.  They now have to pay a whole lot more for the Sprycel.  She is not taking Sprycel daily.  We last saw her back in 2019, her BCR/ABL was up to 27%.  I have a bad feeling that this is even higher now.  She has not had the coronavirus.  She is not going to take the vaccination.  She has been very cautious with being productive.  She is trying to apply for disability.  The Sprycel does cause her a lot of problems.  She does have a lot of fatigue.  She does have problems with focusing.  She just is not able to work a full day.  There has been occasional bouts of diarrhea.  She has had no rash.  She has had no cough.  There is been occasional leg swelling.  Overall, I would have to say that her performance status is probably ECOG 2.    Medications:  Current Outpatient Medications:  .  dasatinib (SPRYCEL) 70 MG tablet, Take 1 tablet (70 mg total) by mouth daily., Disp: 30 tablet, Rfl: 6 .  losartan-hydrochlorothiazide (HYZAAR) 100-12.5 MG tablet, Take 1 tablet by mouth daily., Disp: , Rfl: 0 .  sertraline (ZOLOFT) 100 MG tablet, Take 200 mg by mouth daily after breakfast. , Disp: , Rfl:   Allergies: No Known Allergies  Past Medical History, Surgical history, Social history, and Family History were reviewed and updated.  Review of Systems: Review of Systems  Constitutional: Negative.   HENT:  Negative.   Eyes: Negative.   Respiratory:  Negative.   Cardiovascular: Negative.   Gastrointestinal: Negative.   Endocrine: Negative.   Genitourinary: Negative.    Musculoskeletal: Negative.   Skin: Negative.   Neurological: Negative.   Hematological: Negative.   Psychiatric/Behavioral: Negative.     Physical Exam:  height is 5' 4.96" (1.65 m) and weight is 209 lb (94.8 kg). Her temporal temperature is 97.8 F (36.6 C). Her blood pressure is 142/78 (abnormal) and her pulse is 78. Her respiration is 18 and oxygen saturation is 99%.   Wt Readings from Last 3 Encounters:  01/16/20 209 lb (94.8 kg)  07/22/18 189 lb (85.7 kg)  06/08/18 190 lb (86.2 kg)    Physical Exam Vitals reviewed.  HENT:     Head: Normocephalic and atraumatic.  Eyes:     Pupils: Pupils are equal, round, and reactive to light.  Cardiovascular:     Rate and Rhythm: Normal rate and regular rhythm.     Heart sounds: Normal heart sounds.  Pulmonary:     Effort: Pulmonary effort is normal.     Breath sounds: Normal breath sounds.  Abdominal:     General: Bowel sounds are normal.     Palpations: Abdomen is soft.  Musculoskeletal:        General: No tenderness or deformity. Normal range of  motion.     Cervical back: Normal range of motion.  Lymphadenopathy:     Cervical: No cervical adenopathy.  Skin:    General: Skin is warm and dry.     Findings: No erythema or rash.  Neurological:     Mental Status: She is alert and oriented to person, place, and time.  Psychiatric:        Behavior: Behavior normal.        Thought Content: Thought content normal.        Judgment: Judgment normal.      Lab Results  Component Value Date   WBC 13.8 (H) 01/16/2020   HGB 13.2 01/16/2020   HCT 42.2 01/16/2020   MCV 87.2 01/16/2020   PLT 283 01/16/2020     Chemistry      Component Value Date/Time   NA 137 01/16/2020 0908   NA 132 06/26/2016 1110   K 4.0 01/16/2020 0908   K 3.7 06/26/2016 1110   CL 104 01/16/2020 0908   CL 102 06/26/2016 1110   CO2  26 01/16/2020 0908   CO2 29 06/26/2016 1110   BUN 20 01/16/2020 0908   BUN 17 06/26/2016 1110   CREATININE 0.69 01/16/2020 0908   CREATININE 0.75 07/22/2018 1316   CREATININE 0.2 (L) 06/26/2016 1110      Component Value Date/Time   CALCIUM 9.8 01/16/2020 0908   CALCIUM 9.4 06/26/2016 1110   ALKPHOS 56 01/16/2020 0908   ALKPHOS 88 (H) 06/26/2016 1110   AST 32 01/16/2020 0908   AST 29 07/22/2018 1316   ALT 31 01/16/2020 0908   ALT 33 07/22/2018 1316   ALT 42 06/26/2016 1110   BILITOT 0.4 01/16/2020 0908   BILITOT 0.5 07/22/2018 1316       Impression and Plan: Victoria [name] is a 49 year-old white female with CML.  She has had a long history of CML.  I think she has had it for at least 11 years.   I did look at her blood smear.  I did see some early myeloid cells.  I would have to suspect that her BCR ABL is going to be even higher.  If the BCR/ABL is higher, then we just may have to switch her therapy to something different.  I know that there is a newer class of agents that we might be able to utilize.  Hopefully, she might be able to get disability.  She has a chronic health condition.  We are not going to get rid of this.  The treatment has been tough on her.  I realize that these all TKI agents can be quite difficult.  Unfortunately that is the standard of care that we still use.  We will have to see what the labs look like.  The BCR/ABL will be key for [place].  I will see how we can try to get some kind of assistance for her.     Volanda Napoleon, MD 4/23/202111:29 AM

## 2020-01-19 ENCOUNTER — Telehealth: Payer: Self-pay | Admitting: Hematology & Oncology

## 2020-01-19 NOTE — Telephone Encounter (Signed)
No los 4/23 °

## 2020-01-30 ENCOUNTER — Other Ambulatory Visit: Payer: Self-pay | Admitting: Hematology & Oncology

## 2020-01-30 LAB — BCR ABL1 FISH (GENPATH)

## 2020-02-06 IMAGING — CT CT ABD-PELV W/ CM
2 of 5 series · 16 of 46 positions shown, 18 images · IV contrast (ISOVUE)
Comparison: 08/23/2007 splenic ultrasound.

CLINICAL DATA: Right lower quadrant abdominal pain and fever.
History of leukemia. Taking oral chemotherapy.

EXAM:
CT ABDOMEN AND PELVIS WITH CONTRAST
TECHNIQUE: Multidetector CT imaging of the abdomen and pelvis was performed
using the standard protocol following bolus administration of
intravenous contrast.
CONTRAST:  100mL 4NMZ3G-1EE IOPAMIDOL (4NMZ3G-1EE) INJECTION 61%

[Series 2: axial st · axial · 0.86mm/px · z∈[+756,+1196]mm · 13 of 102 slices shown, 15 images]
[im 7/102  soft-tissue]
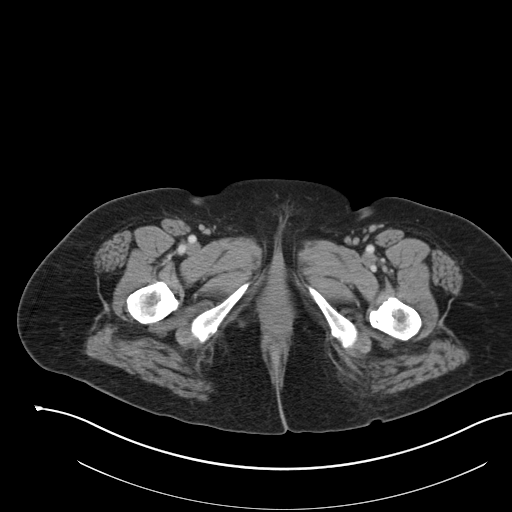
[im 7/102  bone]
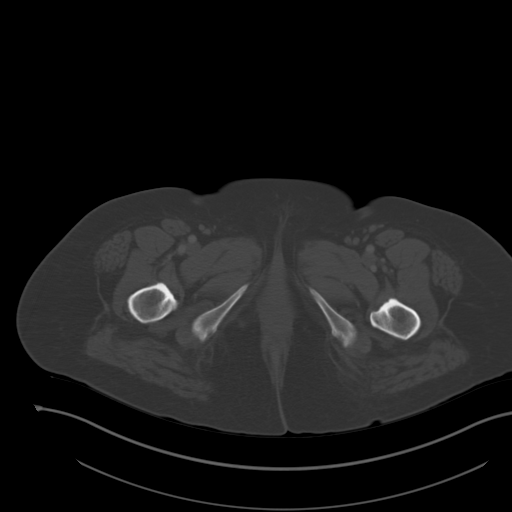
[im 14/102  soft-tissue]
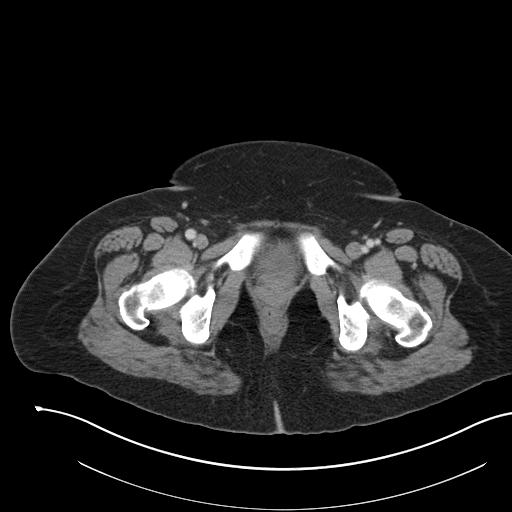
[im 21/102  soft-tissue]
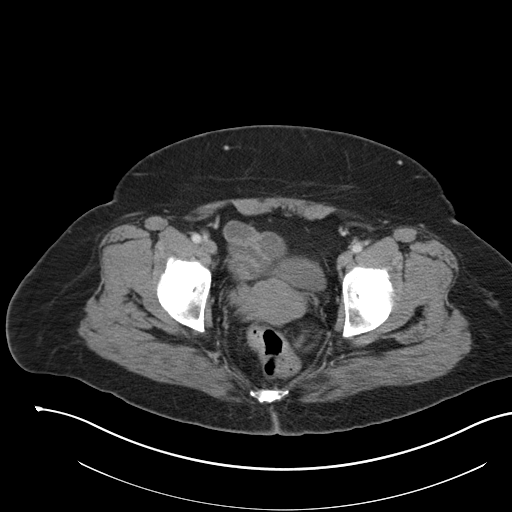
[im 27/102  soft-tissue]
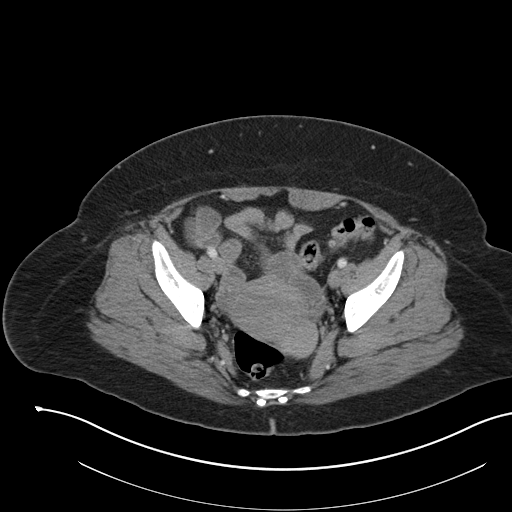
[im 34/102  soft-tissue]
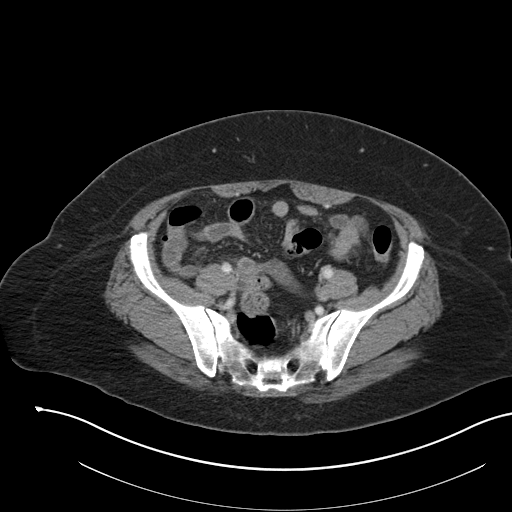
[im 41/102  soft-tissue]
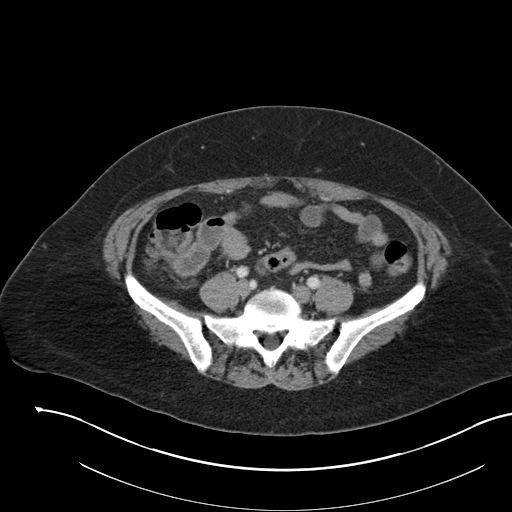
[im 54/102  soft-tissue]
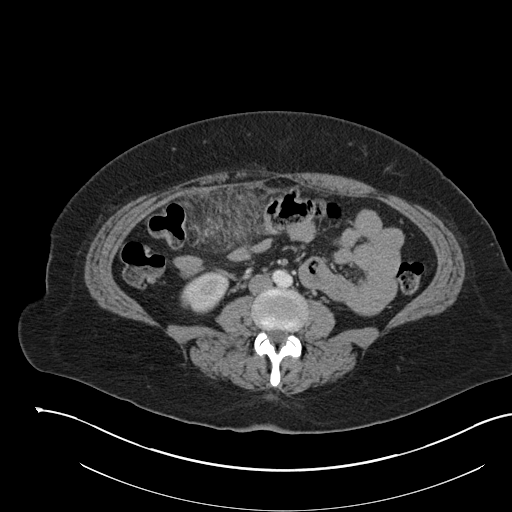
[im 61/102  soft-tissue]
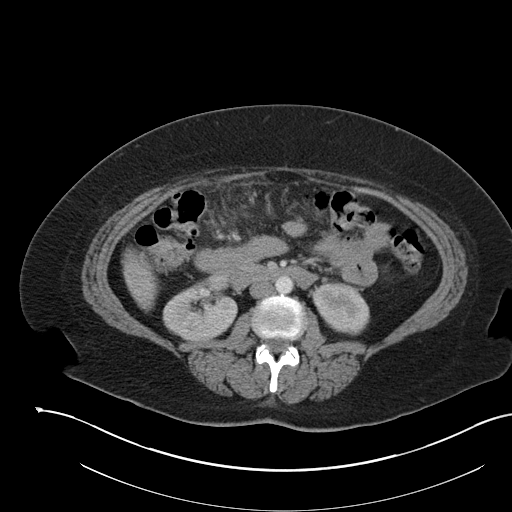
[im 68/102  soft-tissue]
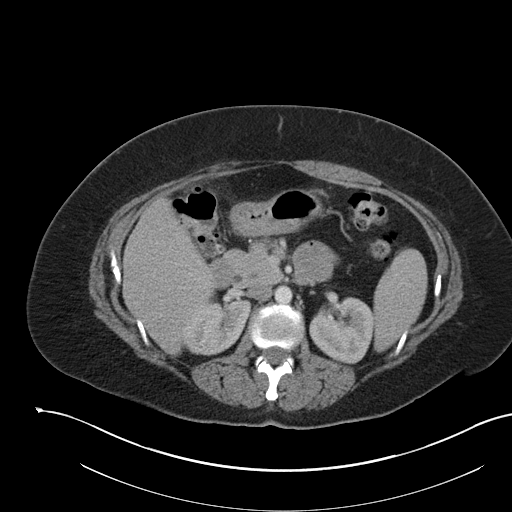
[im 68/102  bone]
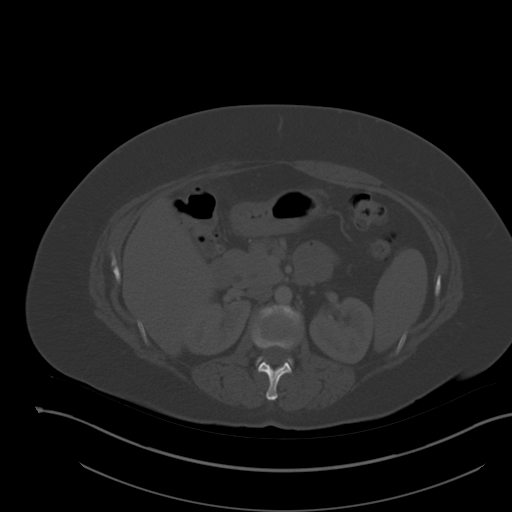
[im 75/102  soft-tissue]
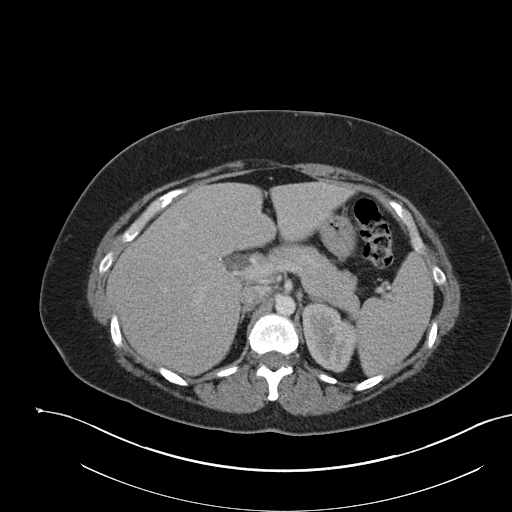
[im 81/102  soft-tissue]
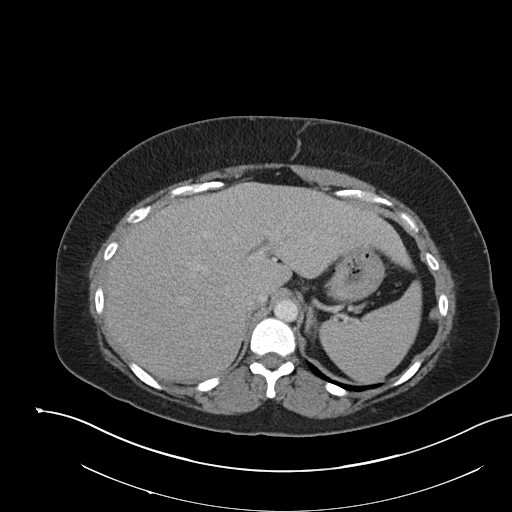
[im 88/102  soft-tissue]
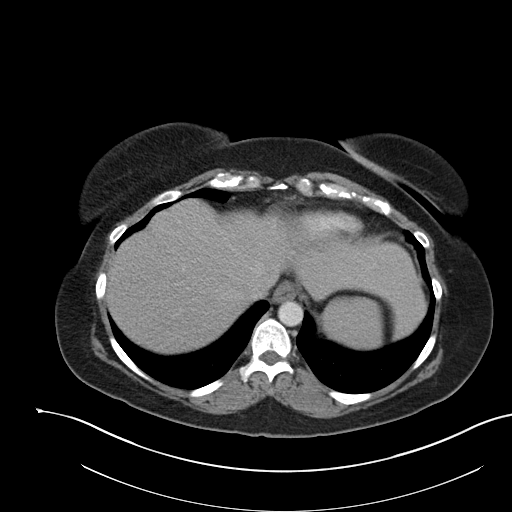
[im 95/102  soft-tissue]
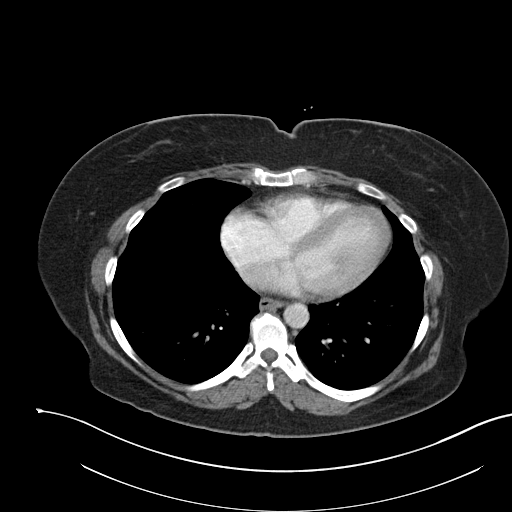

[Series 5: coronal st · coronal · 0.86mm/px · 3 of 96 slices shown]
[im 32/96  soft-tissue]
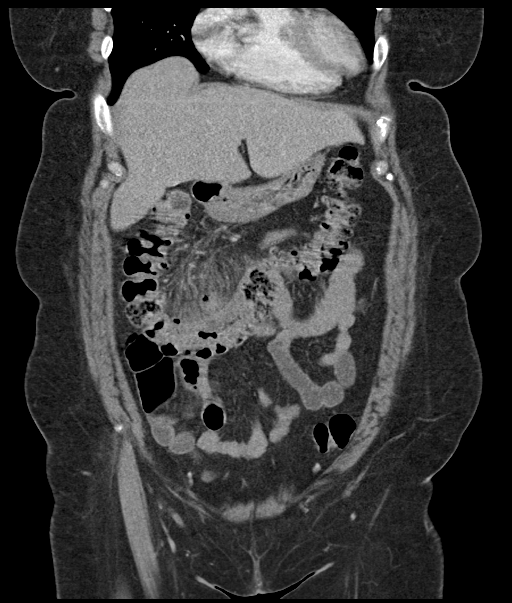
[im 43/96  soft-tissue]
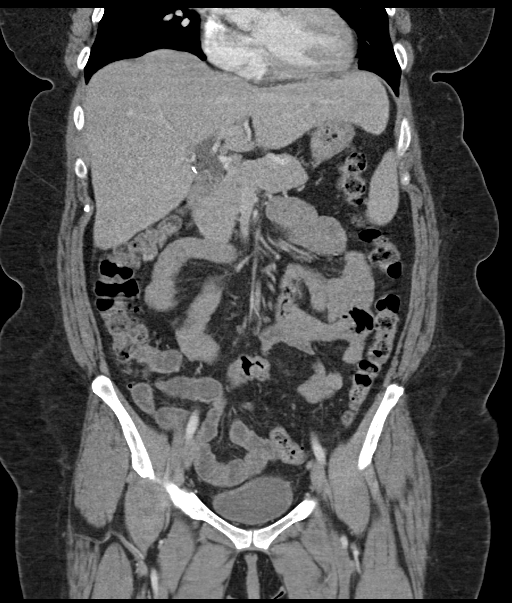
[im 53/96  soft-tissue]
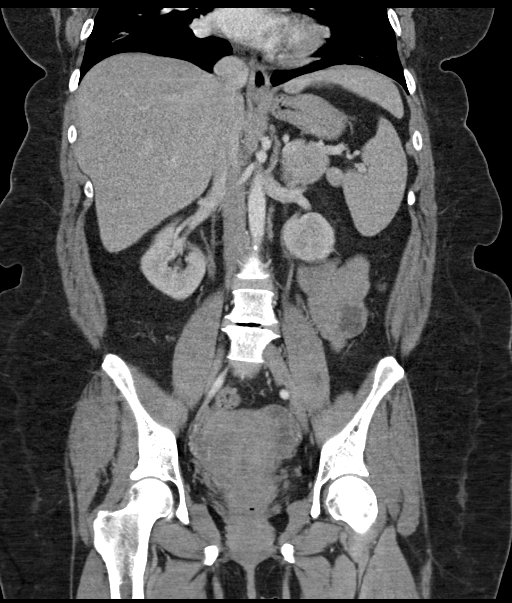

[16 of 46 positions shown; findings below may reference images not displayed]

FINDINGS: Lower chest: Clear lung bases. Normal heart size without pericardial
or pleural effusion.

Hepatobiliary: Variant lateral segment left liver lobe extending in
the left upper quadrant. Cholecystectomy. The common duct is upper
normal to mildly dilated at 12 mm on coronal image 44. Tapers in the
region of the pancreatic head.

Pancreas: Normal, without mass or ductal dilatation.

Spleen: Splenic size upper normal, 12.7 cm craniocaudal.

Adrenals/Urinary Tract: Normal adrenal glands. Normal kidneys,
without hydronephrosis. Normal urinary bladder.

Stomach/Bowel: Gastric antral underdistention.

Scattered colonic diverticula. Moderate inflammation surrounds the
proximal transverse colon, which demonstrates wall thickening and
extensive surrounding diverticula. Example image 56/2 and image 33
coronal. Although there is no free perforation, there are locules of
air which are positioned significantly cephalad to the colonic
lumen, including on image 51/2. No pericolonic fluid collection.

Normal terminal ileum and appendix.

Normal small bowel.

Vascular/Lymphatic: Normal caliber of the aorta and branch vessels.
No abdominopelvic adenopathy.

Reproductive: Retroverted uterus with a posterior body 3.1 cm
fibroid. No adnexal mass.

Other: No significant free fluid.

Musculoskeletal: Presumed bone island in the right acetabulum.
Advanced degenerative disc disease at the L4-5 level.
IMPRESSION: 1. Moderate pericolonic edema surrounding the proximal transverse
colon, favored to represent advanced diverticulitis. No pericolonic
fluid collection.
2. Normal appendix.
3. No abdominopelvic adenopathy.
4. Uterine fibroid.
5. Cholecystectomy with borderline to mild common duct dilatation.
No cause identified. Consider correlation with bilirubin level.

## 2020-02-13 ENCOUNTER — Telehealth: Payer: Self-pay | Admitting: Hematology & Oncology

## 2020-02-13 ENCOUNTER — Inpatient Hospital Stay: Payer: 59 | Admitting: Hematology & Oncology

## 2020-02-13 ENCOUNTER — Inpatient Hospital Stay: Payer: 59

## 2020-02-13 NOTE — Telephone Encounter (Signed)
Called and LMVM for patient to call back in order to reschedule her 5/20 appts per 5/21 sch msg

## 2020-02-20 ENCOUNTER — Other Ambulatory Visit: Payer: Self-pay | Admitting: *Deleted

## 2020-02-20 DIAGNOSIS — C9210 Chronic myeloid leukemia, BCR/ABL-positive, not having achieved remission: Secondary | ICD-10-CM

## 2020-02-24 ENCOUNTER — Other Ambulatory Visit: Payer: Self-pay

## 2020-02-24 ENCOUNTER — Inpatient Hospital Stay: Payer: 59 | Attending: Hematology & Oncology

## 2020-02-24 ENCOUNTER — Inpatient Hospital Stay (HOSPITAL_BASED_OUTPATIENT_CLINIC_OR_DEPARTMENT_OTHER): Payer: 59 | Admitting: Hematology & Oncology

## 2020-02-24 VITALS — BP 147/99 | HR 83 | Temp 97.5°F | Resp 17 | Wt 208.8 lb

## 2020-02-24 DIAGNOSIS — C9210 Chronic myeloid leukemia, BCR/ABL-positive, not having achieved remission: Secondary | ICD-10-CM

## 2020-02-24 DIAGNOSIS — C9212 Chronic myeloid leukemia, BCR/ABL-positive, in relapse: Secondary | ICD-10-CM | POA: Insufficient documentation

## 2020-02-24 DIAGNOSIS — Z79899 Other long term (current) drug therapy: Secondary | ICD-10-CM | POA: Diagnosis not present

## 2020-02-24 LAB — CMP (CANCER CENTER ONLY)
ALT: 24 U/L (ref 0–44)
AST: 24 U/L (ref 15–41)
Albumin: 4.6 g/dL (ref 3.5–5.0)
Alkaline Phosphatase: 58 U/L (ref 38–126)
Anion gap: 8 (ref 5–15)
BUN: 28 mg/dL — ABNORMAL HIGH (ref 6–20)
CO2: 26 mmol/L (ref 22–32)
Calcium: 9.8 mg/dL (ref 8.9–10.3)
Chloride: 105 mmol/L (ref 98–111)
Creatinine: 0.58 mg/dL (ref 0.44–1.00)
GFR, Est AFR Am: >60 mL/min (ref >60)
GFR, Estimated: >60 mL/min (ref >60)
Glucose, Bld: 124 mg/dL — ABNORMAL HIGH (ref 70–99)
Potassium: 4.3 mmol/L (ref 3.5–5.1)
Sodium: 139 mmol/L (ref 135–145)
Total Bilirubin: 0.4 mg/dL (ref 0.3–1.2)
Total Protein: 7.8 g/dL (ref 6.5–8.1)

## 2020-02-24 LAB — CBC WITH DIFFERENTIAL (CANCER CENTER ONLY)
Abs Immature Granulocytes: 13.4 10*3/uL — ABNORMAL HIGH (ref 0.00–0.07)
Band Neutrophils: 9 %
Basophils Absolute: 0.6 10*3/uL — ABNORMAL HIGH (ref 0.0–0.1)
Basophils Relative: 1 %
Eosinophils Absolute: 0.6 10*3/uL — ABNORMAL HIGH (ref 0.0–0.5)
Eosinophils Relative: 1 %
HCT: 40.4 % (ref 36.0–46.0)
Hemoglobin: 12.9 g/dL (ref 12.0–15.0)
Lymphocytes Relative: 10 %
Lymphs Abs: 5.8 10*3/uL — ABNORMAL HIGH (ref 0.7–4.0)
MCH: 27.7 pg (ref 26.0–34.0)
MCHC: 31.9 g/dL (ref 30.0–36.0)
MCV: 86.9 fL (ref 80.0–100.0)
Metamyelocytes Relative: 12 %
Monocytes Absolute: 3.5 10*3/uL — ABNORMAL HIGH (ref 0.1–1.0)
Monocytes Relative: 6 %
Myelocytes: 10 %
Neutro Abs: 34.5 10*3/uL — ABNORMAL HIGH (ref 1.7–7.7)
Neutrophils Relative %: 50 %
Platelet Count: 190 10*3/uL (ref 150–400)
Promyelocytes Relative: 1 %
RBC: 4.65 MIL/uL (ref 3.87–5.11)
RDW: 17.2 % — ABNORMAL HIGH (ref 11.5–15.5)
WBC Count: 58.4 10*3/uL (ref 4.0–10.5)
nRBC: 0.2 % (ref 0.0–0.2)

## 2020-02-24 NOTE — Progress Notes (Signed)
Hematology and Oncology Follow Up Visit  Victoria [name] [id number] Jan 23, 1971 49 y.o. 02/24/2020   Principle Diagnosis:   Chronic Myeloid Leukemia -- Relapsed  Current Therapy:    Sprycel 70 mg po q day     Interim History:  Ms. Victoria [name] is back for a early follow-up.  We last saw her back in April, her BCR/ABL ratio was up to 78%.  Clearly, her CML is becoming more resilient.  She had not been taking the Sprycel every day because of cost issues.  We have been able to address this.  She has been taking the Sprycel daily.  I did send off a mutation analysis to make sure she does not have the T315I mutation.  This would clearly affect how we would treat her.  I think that the best way to go right now is to switch her over to a different TKI agent.  I would probably prefer Bosulif.  I think this would be reasonable.  Again, if she does have the T315I mutation, we will have to get her onto ponatnib.  She feels well.  She really has no complaints.  She has had no nausea or vomiting.  There is been no cough or shortness of breath.  She has had no fever.  Is been no rash.  Overall, her performance status is ECOG 0.   Medications:  Current Outpatient Medications:  .  dasatinib (SPRYCEL) 70 MG tablet, Take 1 tablet (70 mg total) by mouth daily., Disp: 30 tablet, Rfl: 6 .  losartan-hydrochlorothiazide (HYZAAR) 100-12.5 MG tablet, Take 1 tablet by mouth daily., Disp: , Rfl: 0 .  sertraline (ZOLOFT) 100 MG tablet, Take 200 mg by mouth daily after breakfast. , Disp: , Rfl:   Allergies: No Known Allergies  Past Medical History, Surgical history, Social history, and Family History were reviewed and updated.  Review of Systems: Review of Systems  Constitutional: Negative.   HENT:  Negative.   Eyes: Negative.   Respiratory: Negative.   Cardiovascular: Negative.   Gastrointestinal: Negative.   Endocrine: Negative.   Genitourinary: Negative.    Musculoskeletal: Negative.   Skin: Negative.    Neurological: Negative.   Hematological: Negative.   Psychiatric/Behavioral: Negative.     Physical Exam:  weight is 208 lb 12 oz (94.7 kg). Her temporal temperature is 97.5 F (36.4 C) (abnormal). Her blood pressure is 147/99 (abnormal) and her pulse is 83. Her respiration is 17 and oxygen saturation is 99%.   Wt Readings from Last 3 Encounters:  02/24/20 208 lb 12 oz (94.7 kg)  01/16/20 209 lb (94.8 kg)  07/22/18 189 lb (85.7 kg)    Physical Exam Vitals reviewed.  HENT:     Head: Normocephalic and atraumatic.  Eyes:     Pupils: Pupils are equal, round, and reactive to light.  Cardiovascular:     Rate and Rhythm: Normal rate and regular rhythm.     Heart sounds: Normal heart sounds.  Pulmonary:     Effort: Pulmonary effort is normal.     Breath sounds: Normal breath sounds.  Abdominal:     General: Bowel sounds are normal.     Palpations: Abdomen is soft.  Musculoskeletal:        General: No tenderness or deformity. Normal range of motion.     Cervical back: Normal range of motion.  Lymphadenopathy:     Cervical: No cervical adenopathy.  Skin:    General: Skin is warm and dry.     Findings: No  erythema or rash.  Neurological:     Mental Status: She is alert and oriented to person, place, and time.  Psychiatric:        Behavior: Behavior normal.        Thought Content: Thought content normal.        Judgment: Judgment normal.      Lab Results  Component Value Date   WBC 58.4 (HH) 02/24/2020   HGB 12.9 02/24/2020   HCT 40.4 02/24/2020   MCV 86.9 02/24/2020   PLT 190 02/24/2020     Chemistry      Component Value Date/Time   NA 139 02/24/2020 1009   NA 132 06/26/2016 1110   K 4.3 02/24/2020 1009   K 3.7 06/26/2016 1110   CL 105 02/24/2020 1009   CL 102 06/26/2016 1110   CO2 26 02/24/2020 1009   CO2 29 06/26/2016 1110   BUN 28 (H) 02/24/2020 1009   BUN 17 06/26/2016 1110   CREATININE 0.58 02/24/2020 1009   CREATININE 0.2 (L) 06/26/2016 1110       Component Value Date/Time   CALCIUM 9.8 02/24/2020 1009   CALCIUM 9.4 06/26/2016 1110   ALKPHOS 58 02/24/2020 1009   ALKPHOS 88 (H) 06/26/2016 1110   AST 24 02/24/2020 1009   ALT 24 02/24/2020 1009   ALT 42 06/26/2016 1110   BILITOT 0.4 02/24/2020 1009       Impression and Plan: Victoria [name] is a 49 year-old white female with CML.  She has had a long history of CML.  I think she has had it for at least 11 years.   I did look at her blood smear.  I did see some early myeloid cells.  I did not see any blasts.  I do not think she is in any kind accelerated phase.  She clearly is not in a blast phase.  I will wait to see what the mutation analysis shows.  This will give [place] an idea as to how we can try to treat this.  My guess is that we are going to have to make a change to a different agent.  We will plan to get her back after we see what the mutation analysis shows and see how we can treat this CML.  She does have a sensitivity to these TKI drugs so we will have to be careful with the toxicity profiles.       Volanda Napoleon, MD 6/1/202110:58 AM

## 2020-02-25 ENCOUNTER — Telehealth: Payer: Self-pay | Admitting: Hematology & Oncology

## 2020-02-25 NOTE — Telephone Encounter (Signed)
No los 6/1

## 2020-02-26 ENCOUNTER — Other Ambulatory Visit: Payer: Self-pay | Admitting: *Deleted

## 2020-02-26 DIAGNOSIS — C9210 Chronic myeloid leukemia, BCR/ABL-positive, not having achieved remission: Secondary | ICD-10-CM

## 2020-03-09 LAB — BCR/ABL

## 2020-03-16 ENCOUNTER — Encounter: Payer: Self-pay | Admitting: Hematology & Oncology

## 2020-03-22 ENCOUNTER — Other Ambulatory Visit: Payer: Self-pay | Admitting: Hematology & Oncology

## 2020-03-22 ENCOUNTER — Telehealth: Payer: Self-pay | Admitting: *Deleted

## 2020-03-22 ENCOUNTER — Other Ambulatory Visit: Payer: Self-pay | Admitting: *Deleted

## 2020-03-22 MED ORDER — BOSUTINIB 500 MG PO TABS
500.0000 mg | ORAL_TABLET | Freq: Every day | ORAL | 5 refills | Status: DC
Start: 2020-03-22 — End: 2020-03-22

## 2020-03-22 MED ORDER — BOSUTINIB 500 MG PO TABS: 500.0000 mg | ORAL_TABLET | Freq: Every day | ORAL | 5 refills | Status: DC

## 2020-03-22 NOTE — Progress Notes (Signed)
We got the mutation analysis back for Ms. Victoria [name] with respect to the BCR/ABL gene.  There is no detectable mutation in the BCR/ABL gene.  I will have to think that she has a resistance to the Sprycel.  Given that, I think we will have to make a change.  Of note, her last BCR/ABL was 70.8%.  I think will be reasonable would be Bosulif.  She has never been on this before.  Hopefully, she will tolerate this.  The dose is 500 mg a day.  The toxicities that we have to watch out for are hepatic.  Also we need to be careful with blood counts.  There might be some issue with diarrhea.  Fluid retention also might be noted.  We will hopefully be able to get this to Ms. Victoria [name].  I realize that there are some issues with cost.  We will plan to get her back to see [place] in about a month.  By then, she should be on the Bosulif.  Victoria Haw, MD

## 2020-03-22 NOTE — Telephone Encounter (Signed)
Received call from patient requesting results of her tests.  BCR ABL is detected but no mutatuions noted in [facility] report.  Patient was told that Dr. Marin Olp was going to put her on a different medication. Patient states she will stay on Sprycel until she hears from our office.  She has 15 days of medication remaining.

## 2020-03-23 ENCOUNTER — Telehealth: Payer: Self-pay | Admitting: Pharmacy Technician

## 2020-03-23 ENCOUNTER — Telehealth: Payer: Self-pay | Admitting: Hematology & Oncology

## 2020-03-23 NOTE — Telephone Encounter (Signed)
Appointments scheduled calendar printed & mailed per 6/28 staff message

## 2020-03-23 NOTE — Telephone Encounter (Signed)
Oral Oncology Patient Advocate Encounter  Received notification from [employer] that prior authorization for Bosulif is required.  PA submitted on CoverMyMeds Key [id number] Status is pending  Oral Oncology Clinic will continue to follow.  Victoria [name] Patient [place] Phone [phone] Fax [phone] 03/24/2020 10:09 AM

## 2020-03-24 ENCOUNTER — Telehealth: Payer: Self-pay | Admitting: Pharmacist

## 2020-03-24 DIAGNOSIS — C9210 Chronic myeloid leukemia, BCR/ABL-positive, not having achieved remission: Secondary | ICD-10-CM

## 2020-03-24 MED ORDER — BOSUTINIB 500 MG PO TABS: 500.0000 mg | ORAL_TABLET | Freq: Every day | ORAL | 5 refills | Status: DC

## 2020-03-24 NOTE — Telephone Encounter (Signed)
Oral Oncology Pharmacist Encounter  Received new prescription for Bosulif (bosutinib) for the treatment of relapsed CML, planned duration until disease progression or unacceptable drug toxicity.  CMP from 02/24/20 assessed, no relevant lab abnormalities. Prescription dose and frequency assessed.   Current medication list in Epic reviewed, no DDIs with bosutinib identified.  Prescription has been e-scribed to the [facility] for benefits analysis and approval.  Oral Oncology Clinic will continue to follow for insurance authorization, copayment issues, initial counseling and start date.  Darl Pikes, PharmD, BCPS, BCOP, CPP Hematology/Oncology Clinical Pharmacist Practitioner [facility] [phone]  03/24/2020 10:01 AM

## 2020-03-24 NOTE — Telephone Encounter (Addendum)
Oral Oncology Patient Advocate Encounter  Prior Authorization for Bosulif has been approved.    PA# [id number] Effective dates: 03/24/20 through 09/20/20  Prescription has been sent to [facility] per insurance.  Oral Oncology Clinic will continue to follow.   [place] Patient [place] Phone [phone] Fax [phone] 03/24/2020 10:38 AM

## 2020-03-24 NOTE — Telephone Encounter (Addendum)
Oral Chemotherapy Pharmacist Encounter  Due to insurance restriction the medication could not be filled at [facility]. Prescription has been e-scribed to [facility].  Supportive information was faxed to [facility]. We will continue to follow medication access.   Attempting to call patient to let them know, no answer, LVM for patient to call me back.  Darl Pikes, PharmD, BCPS, [facility] Hematology/Oncology Clinical Pharmacist [facility] Oral [facility] [phone]  03/24/2020 10:26 AM

## 2020-04-09 NOTE — Telephone Encounter (Signed)
Patient copay $0.00 after copay card.  Medication delivered on 04/06/20.

## 2020-04-26 ENCOUNTER — Inpatient Hospital Stay: Payer: 59

## 2020-04-26 ENCOUNTER — Inpatient Hospital Stay: Payer: 59 | Admitting: Hematology & Oncology

## 2020-05-10 ENCOUNTER — Inpatient Hospital Stay: Payer: 59 | Admitting: Hematology & Oncology

## 2020-05-10 ENCOUNTER — Inpatient Hospital Stay: Payer: 59

## 2020-06-08 ENCOUNTER — Inpatient Hospital Stay: Payer: 59 | Admitting: Hematology & Oncology

## 2020-06-08 ENCOUNTER — Inpatient Hospital Stay: Payer: 59

## 2020-07-05 ENCOUNTER — Other Ambulatory Visit: Payer: Self-pay | Admitting: *Deleted

## 2020-07-05 DIAGNOSIS — C9210 Chronic myeloid leukemia, BCR/ABL-positive, not having achieved remission: Secondary | ICD-10-CM

## 2020-07-06 ENCOUNTER — Inpatient Hospital Stay: Payer: 59

## 2020-07-06 ENCOUNTER — Inpatient Hospital Stay: Payer: 59 | Admitting: Hematology & Oncology

## 2021-01-26 ENCOUNTER — Telehealth: Payer: Self-pay

## 2021-01-26 NOTE — Telephone Encounter (Signed)
Pt called in to r/s from her cancelled appt 2021   Victoria [name]

## 2021-02-09 ENCOUNTER — Other Ambulatory Visit (HOSPITAL_COMMUNITY): Payer: Self-pay

## 2021-02-28 ENCOUNTER — Telehealth: Payer: Self-pay | Admitting: *Deleted

## 2021-02-28 ENCOUNTER — Telehealth: Payer: Self-pay | Admitting: Pharmacy Technician

## 2021-02-28 ENCOUNTER — Telehealth: Payer: Self-pay | Admitting: Pharmacist

## 2021-02-28 ENCOUNTER — Other Ambulatory Visit (HOSPITAL_COMMUNITY): Payer: Self-pay

## 2021-02-28 ENCOUNTER — Inpatient Hospital Stay: Payer: 59 | Attending: Hematology & Oncology

## 2021-02-28 ENCOUNTER — Inpatient Hospital Stay (HOSPITAL_BASED_OUTPATIENT_CLINIC_OR_DEPARTMENT_OTHER): Payer: 59 | Admitting: Hematology & Oncology

## 2021-02-28 ENCOUNTER — Encounter: Payer: Self-pay | Admitting: Hematology & Oncology

## 2021-02-28 ENCOUNTER — Other Ambulatory Visit: Payer: Self-pay

## 2021-02-28 VITALS — BP 138/78 | HR 82 | Temp 99.9°F | Resp 18 | Wt 208.2 lb

## 2021-02-28 DIAGNOSIS — C9210 Chronic myeloid leukemia, BCR/ABL-positive, not having achieved remission: Secondary | ICD-10-CM | POA: Insufficient documentation

## 2021-02-28 DIAGNOSIS — Z79899 Other long term (current) drug therapy: Secondary | ICD-10-CM | POA: Insufficient documentation

## 2021-02-28 LAB — CBC WITH DIFFERENTIAL (CANCER CENTER ONLY)
Abs Immature Granulocytes: 19.68 10*3/uL — ABNORMAL HIGH (ref 0.00–0.07)
Basophils Absolute: 2.1 10*3/uL — ABNORMAL HIGH (ref 0.0–0.1)
Basophils Relative: 3 %
Eosinophils Absolute: 0.2 10*3/uL (ref 0.0–0.5)
Eosinophils Relative: 0 %
HCT: 38.3 % (ref 36.0–46.0)
Hemoglobin: 12.7 g/dL (ref 12.0–15.0)
Immature Granulocytes: 33 %
Lymphocytes Relative: 9 %
Lymphs Abs: 5.1 10*3/uL — ABNORMAL HIGH (ref 0.7–4.0)
MCH: 30.1 pg (ref 26.0–34.0)
MCHC: 33.2 g/dL (ref 30.0–36.0)
MCV: 90.8 fL (ref 80.0–100.0)
Monocytes Absolute: 4.7 10*3/uL — ABNORMAL HIGH (ref 0.1–1.0)
Monocytes Relative: 8 %
Neutro Abs: 28.2 10*3/uL — ABNORMAL HIGH (ref 1.7–7.7)
Neutrophils Relative %: 47 %
Platelet Count: 125 10*3/uL — ABNORMAL LOW (ref 150–400)
RBC: 4.22 MIL/uL (ref 3.87–5.11)
RDW: 16.8 % — ABNORMAL HIGH (ref 11.5–15.5)
WBC Count: 60 10*3/uL (ref 4.0–10.5)
nRBC: 0.3 % — ABNORMAL HIGH (ref 0.0–0.2)

## 2021-02-28 LAB — COMPREHENSIVE METABOLIC PANEL
ALT: 27 U/L (ref 0–44)
AST: 41 U/L (ref 15–41)
Albumin: 4.5 g/dL (ref 3.5–5.0)
Alkaline Phosphatase: 64 U/L (ref 38–126)
Anion gap: 9 (ref 5–15)
BUN: 20 mg/dL (ref 6–20)
CO2: 27 mmol/L (ref 22–32)
Calcium: 9.9 mg/dL (ref 8.9–10.3)
Chloride: 104 mmol/L (ref 98–111)
Creatinine, Ser: 0.74 mg/dL (ref 0.44–1.00)
GFR, Estimated: >60 mL/min (ref >60)
Glucose, Bld: 108 mg/dL — ABNORMAL HIGH (ref 70–99)
Potassium: 4.7 mmol/L (ref 3.5–5.1)
Sodium: 140 mmol/L (ref 135–145)
Total Bilirubin: 0.5 mg/dL (ref 0.3–1.2)
Total Protein: 7.8 g/dL (ref 6.5–8.1)

## 2021-02-28 MED ORDER — SCEMBLIX 40 MG PO TABS
80.0000 mg | ORAL_TABLET | Freq: Every day | ORAL | 6 refills | Status: DC
  Filled 2021-02-28: qty 60, fill #0

## 2021-02-28 NOTE — Telephone Encounter (Signed)
Dr. Marin Olp notified of WBC-60.0.  No new orders received at this time.

## 2021-02-28 NOTE — Progress Notes (Signed)
Hematology and Oncology Follow Up Visit  Victoria [name] [id number] September 25, 1971 50 y.o. 02/28/2021   Principle Diagnosis:   Chronic Myeloid Leukemia -- Relapsed  Current Therapy:    Scemblix 80 mg po q day -- start on 03/06/2021     Interim History:  Victoria [name] is back for a follow-up.  It has been a year since we saw Victoria.  We moved Victoria from [place] over to [place].  She had a hard time with the Bosulif.  She has not have the [id number] mutation.  We did check Victoria for this.  Unfortunately, there has been issues with Victoria taking the Bosulif.  She has not taken it for about a week and a half.  She has been taking it on and off because of some toxicity.  I really think that we are going to have to get Victoria on Scemblix.  I think that by Victoria blood smear, I am worried that she is trying to move over to a more aggressive phase of the CML.  I do not think this is blast phase.  It may possibly be moving toward a more accelerated phase.  Hopefully, we will be able to give Victoria the Scemblix.  I realize that there can be a lot of financial issues.  She cannot afford Victoria medications.  I will have to see if we can get assistance for Victoria.  Otherwise she seems to be feeling okay.  She has had no fever.  She has had no problems with COVID.  She has had no cough or shortness of breath.  There is been no change in bowel or bladder habits.  Victoria appetite seems to be doing okay without any nausea or vomiting.  She has had no rashes.  There has been no bleeding.  Victoria [name] now is 50 years old.  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  I would have to say that Victoria overall performance status is probably ECOG 1.    Medications:  Current Outpatient Medications:  .  bosutinib (BOSULIF) 500 MG tablet, Take 1 tablet (500 mg total) by mouth daily with breakfast. Take with food., Disp: 30 tablet, Rfl: 5 .  losartan-hydrochlorothiazide (HYZAAR) 100-12.5 MG tablet, Take 1 tablet by mouth daily., Disp: , Rfl: 0 .  sertraline  (ZOLOFT) 100 MG tablet, Take 200 mg by mouth daily after breakfast. , Disp: , Rfl:   Allergies: No Known Allergies  Past Medical History, Surgical history, Social history, and Family History were reviewed and updated.  Review of Systems: Review of Systems  Constitutional: Negative.   HENT:  Negative.   Eyes: Negative.   Respiratory: Negative.   Cardiovascular: Negative.   Gastrointestinal: Negative.   Endocrine: Negative.   Genitourinary: Negative.    Musculoskeletal: Negative.   Skin: Negative.   Neurological: Negative.   Hematological: Negative.   Psychiatric/Behavioral: Negative.     Physical Exam:  weight is 94.4 kg. Victoria oral temperature is 99.9 F (37.7 C). Victoria blood pressure is 138/78 and Victoria pulse is 82. Victoria respiration is 18 and oxygen saturation is 100%.   Wt Readings from Last 3 Encounters:  02/28/21 94.4 kg  02/24/20 94.7 kg  01/16/20 94.8 kg    Physical Exam Vitals reviewed.  HENT:     Head: Normocephalic and atraumatic.  Eyes:     Pupils: Pupils are equal, round, and reactive to light.  Cardiovascular:     Rate and Rhythm: Normal rate and regular rhythm.     Heart sounds: Normal  heart sounds.  Pulmonary:     Effort: Pulmonary effort is normal.     Breath sounds: Normal breath sounds.  Abdominal:     General: Bowel sounds are normal.     Palpations: Abdomen is soft.  Musculoskeletal:        General: No tenderness or deformity. Normal range of motion.     Cervical back: Normal range of motion.  Lymphadenopathy:     Cervical: No cervical adenopathy.  Skin:    General: Skin is warm and dry.     Findings: No erythema or rash.  Neurological:     Mental Status: She is alert and oriented to person, place, and time.  Psychiatric:        Behavior: Behavior normal.        Thought Content: Thought content normal.        Judgment: Judgment normal.      Lab Results  Component Value Date   WBC 60.0 (HH) 02/28/2021   HGB 12.7 02/28/2021   HCT 38.3  02/28/2021   MCV 90.8 02/28/2021   PLT 125 (L) 02/28/2021     Chemistry      Component Value Date/Time   NA 140 02/28/2021 0815   NA 132 06/26/2016 1110   K 4.7 02/28/2021 0815   K 3.7 06/26/2016 1110   CL 104 02/28/2021 0815   CL 102 06/26/2016 1110   CO2 27 02/28/2021 0815   CO2 29 06/26/2016 1110   BUN 20 02/28/2021 0815   BUN 17 06/26/2016 1110   CREATININE 0.74 02/28/2021 0815   CREATININE 0.58 02/24/2020 1009   CREATININE 0.2 (L) 06/26/2016 1110      Component Value Date/Time   CALCIUM 9.9 02/28/2021 0815   CALCIUM 9.4 06/26/2016 1110   ALKPHOS 64 02/28/2021 0815   ALKPHOS 88 (H) 06/26/2016 1110   AST 41 02/28/2021 0815   AST 24 02/24/2020 1009   ALT 27 02/28/2021 0815   ALT 24 02/24/2020 1009   ALT 42 06/26/2016 1110   BILITOT 0.5 02/28/2021 0815   BILITOT 0.4 02/24/2020 1009       Impression and Plan: Victoria [name] is a 50 year-old white female with CML.  She has had a long history of CML.  I think she has had it for at least 12 years.   I am going to try Victoria on Scemblix.  Hopefully, she will be able to manage this.  I would like to think that toxicity wise should not be any problems.  I think the real problem is try to get Victoria assistance for this so she will not have to pay for it.  I will put Victoria on Scemblix at 80 mg a day.  I think this will help [place] out.  Will be very interesting to see what Victoria BCR/ABL analysis is.  I really have to follow Victoria [name] closely.  Again I realize it is hard for Victoria to come to the office because of financial issues.  However, I really want to make sure that we get Victoria back into a remission so she can enjoy long healthy life.    Volanda Napoleon, MD 6/6/202211:43 AM

## 2021-02-28 NOTE — Telephone Encounter (Signed)
Oral Oncology Patient Advocate Encounter  Received notification from [employer] that prior authorization for Scemblix is required.  PA submitted on CoverMyMeds Key [id number] Status is pending  Oral Oncology Clinic will continue to follow.  [place] Patient [place] Phone [phone] Fax [phone] 02/28/2021 4:17 PM

## 2021-02-28 NOTE — Telephone Encounter (Signed)
Oral Oncology Pharmacist Encounter  Received new prescription for Scemblix (asciminib) for the treatment of relapsed CML, planned duration until disease progression or unacceptable drug toxicity.  Prescription dose and frequency assessed for appropriateness. Appropriate for therapy initiation.   CMP and CBC w/ Diff from 02/28/21 assessed, no baseline dose adjustments required.  Current medication list in Epic reviewed, DDIs with Scemblix identified:  Category C DDI between Scemblix and Losartan - Scemblix may decrease serum concentrations of the active metabolites of Losartan, resulting in decreased efficacy of Losartan. Recommend monitoring for increase in BP while on Scemblix as HTN is also a possible side effect of Scemblix.   Evaluated chart and no patient barriers to medication adherence noted.   Prescription has been e-scribed to the [facility] for benefits analysis and approval.  Oral Oncology Clinic will continue to follow for insurance authorization, copayment issues, initial counseling and start date.  Leron Croak, PharmD, BCPS Hematology/Oncology Clinical Pharmacist [facility] [phone] 02/28/2021 1:36 PM

## 2021-03-01 ENCOUNTER — Other Ambulatory Visit (HOSPITAL_COMMUNITY): Payer: Self-pay

## 2021-03-01 ENCOUNTER — Telehealth: Payer: Self-pay

## 2021-03-01 NOTE — Telephone Encounter (Signed)
Pt is aware of her f/u appt per 02/28/21 los   Victoria [name]

## 2021-03-01 NOTE — Telephone Encounter (Signed)
Oral Oncology Patient Advocate Encounter  Checked status of PA this morning and the authorization had been cancelled.  Message in [facility] said to call [phone] for additional information.  Called and spoke to rep and she said the PA had been relaunched by [employer] and they needed additional information.  Answered questions over the phone and rep submitted as urgent.  A determination should be made in 24 hours.  [place] Patient [place] Phone [phone] Fax [phone] 03/01/2021 12:08 PM

## 2021-03-02 ENCOUNTER — Encounter: Payer: Self-pay | Admitting: Pharmacy Technician

## 2021-03-02 MED ORDER — SCEMBLIX 40 MG PO TABS: 80.0000 mg | ORAL_TABLET | Freq: Every day | ORAL | 6 refills | Status: DC

## 2021-03-02 NOTE — Telephone Encounter (Signed)
Oral Oncology Patient Advocate Encounter  Prior Authorization for Scemblix has been approved.    PA# [id number] Effective dates: 03/01/21 through 02/28/22  Oral Oncology Clinic will continue to follow.   [place] Patient [place] Phone [phone] Fax [phone] 03/02/2021 8:34 AM

## 2021-03-02 NOTE — Telephone Encounter (Signed)
Oral Oncology Pharmacist Encounter  Patient's insurance requires that prescription be filled through [facility]. Prescription redirected to [facility] for dispensing.  Leron Croak, PharmD, BCPS Hematology/Oncology Clinical Pharmacist [facility] [phone] 03/02/2021 10:05 AM

## 2021-03-02 NOTE — Telephone Encounter (Signed)
Patient must use [facility] per [employer].

## 2021-03-07 LAB — BCR ABL1 FISH (GENPATH)

## 2021-03-09 NOTE — Telephone Encounter (Addendum)
Oral Chemotherapy Pharmacist Encounter   Attempted to reach patient to provide update and offer for initial counseling on oral medication: Scemblix (asciminib).   No answer. Left voicemail for patient to call back to discuss details of medication acquisition and initial counseling session.  Patient is required to fill through [facility]. Prescription already sent to [facility] for dispensing.   Patient medication education sheet placed in mail for patient.   Leron Croak, PharmD, BCPS Hematology/Oncology Clinical Pharmacist [facility] [phone] 03/09/2021 10:37 AM

## 2021-03-10 NOTE — Telephone Encounter (Signed)
Oral Oncology Patient Advocate Encounter  Per [employer] rep, Scemblix copay is $8525.34. Patient has a high deductible plan.  Signed patient up for [employer] Copay card to cover copay for Scemblix.  Patient will have to pay the first $25 and the card will cover the rest.  Patient is aware and ok with paying the $25.  [employer] will reach out to the patient to schedule delivery.  [place] Patient [place] Phone [phone] Fax [phone] 03/10/2021 12:15 PM

## 2021-03-10 NOTE — Telephone Encounter (Signed)
Oral Chemotherapy Pharmacist Encounter  Patient Education Patient returned call and I spoke with patient for overview of new oral chemotherapy medication: Scemblix (asciminib) for the treatment of relapsed CML, planned duration until disease progression or unacceptable drug toxicity.  Pt is doing well. Counseled patient on administration, dosing, side effects, monitoring, drug-food interactions, safe handling, storage, and disposal.  Patient will take Scemblix 40 mg tablets, 2 tablets (80 mg total) by mouth daily. Take on an empty stomach at least 2 hours before or 1 hour after food  Patient knows to avoid grapefruit and grapefruit juice while on Scemblix.  Start date: pending medication acquisition from [facility] - patient has [employer]'s phone number for follow-up [phone]), she states they called a few days ago but were still processing medication at that time.   Side effects include but not limited to: decreased blood counts, increased blood pressure, rash, increased cholesterol levels, increase in LFTs, myalgias, fatigue.     Reviewed with patient importance of keeping a medication schedule and plan for any missed doses.  After discussion with patient no patient barriers to medication adherence identified.   Ms. Rawl voiced understanding and appreciation. All questions answered. Medication handout provided.  Provided patient with [facility] phone number. Patient knows to call the office with questions or concerns. Oral Chemotherapy Navigation Clinic will continue to follow.  Leron Croak, PharmD, BCPS Hematology/Oncology Clinical Pharmacist [facility] [phone] 03/10/2021 8:10 AM

## 2021-03-16 NOTE — Telephone Encounter (Signed)
Spoke to patient today and she had not received a call from [facility].  I emailed our rep, Kevan Ny, and made sure he had a correct phone number to contact the patient.  Jared sent a follow up email to state that they got in touch with Victoria [name] and she should receive Scemblix on 03/17/21.  [place] Patient [place] Phone [phone] Fax [phone] 03/16/2021 10:54 PM

## 2021-04-11 ENCOUNTER — Other Ambulatory Visit: Payer: 59

## 2021-04-11 ENCOUNTER — Ambulatory Visit: Payer: 59 | Admitting: Hematology & Oncology

## 2021-05-02 ENCOUNTER — Ambulatory Visit: Payer: 59 | Admitting: Hematology & Oncology

## 2021-05-02 ENCOUNTER — Inpatient Hospital Stay: Payer: 59 | Attending: Hematology & Oncology

## 2021-11-09 ENCOUNTER — Inpatient Hospital Stay: Payer: Self-pay | Attending: Hematology & Oncology

## 2021-11-09 ENCOUNTER — Inpatient Hospital Stay (HOSPITAL_BASED_OUTPATIENT_CLINIC_OR_DEPARTMENT_OTHER): Payer: Self-pay | Admitting: Hematology & Oncology

## 2021-11-09 ENCOUNTER — Other Ambulatory Visit: Payer: Self-pay

## 2021-11-09 ENCOUNTER — Telehealth: Payer: Self-pay | Admitting: *Deleted

## 2021-11-09 ENCOUNTER — Encounter: Payer: Self-pay | Admitting: Hematology & Oncology

## 2021-11-09 VITALS — BP 158/95 | HR 85 | Temp 98.2°F | Resp 18 | Ht 64.96 in | Wt 196.1 lb

## 2021-11-09 DIAGNOSIS — C9212 Chronic myeloid leukemia, BCR/ABL-positive, in relapse: Secondary | ICD-10-CM | POA: Insufficient documentation

## 2021-11-09 DIAGNOSIS — C9210 Chronic myeloid leukemia, BCR/ABL-positive, not having achieved remission: Secondary | ICD-10-CM

## 2021-11-09 DIAGNOSIS — R197 Diarrhea, unspecified: Secondary | ICD-10-CM | POA: Insufficient documentation

## 2021-11-09 DIAGNOSIS — Z79899 Other long term (current) drug therapy: Secondary | ICD-10-CM | POA: Insufficient documentation

## 2021-11-09 LAB — CBC WITH DIFFERENTIAL (CANCER CENTER ONLY)
Abs Immature Granulocytes: 0.04 10*3/uL (ref 0.00–0.07)
Basophils Absolute: 0 10*3/uL (ref 0.0–0.1)
Basophils Relative: 1 %
Eosinophils Absolute: 0 10*3/uL (ref 0.0–0.5)
Eosinophils Relative: 1 %
HCT: 40.1 % (ref 36.0–46.0)
Hemoglobin: 13.6 g/dL (ref 12.0–15.0)
Immature Granulocytes: 1 %
Lymphocytes Relative: 30 %
Lymphs Abs: 1.8 10*3/uL (ref 0.7–4.0)
MCH: 29.7 pg (ref 26.0–34.0)
MCHC: 33.9 g/dL (ref 30.0–36.0)
MCV: 87.6 fL (ref 80.0–100.0)
Monocytes Absolute: 0.4 10*3/uL (ref 0.1–1.0)
Monocytes Relative: 7 %
Neutro Abs: 3.5 10*3/uL (ref 1.7–7.7)
Neutrophils Relative %: 60 %
Platelet Count: 182 10*3/uL (ref 150–400)
RBC: 4.58 MIL/uL (ref 3.87–5.11)
RDW: 15.2 % (ref 11.5–15.5)
WBC Count: 5.8 10*3/uL (ref 4.0–10.5)
nRBC: 0 % (ref 0.0–0.2)

## 2021-11-09 LAB — CMP (CANCER CENTER ONLY)
ALT: 13 U/L (ref 0–44)
AST: 14 U/L — ABNORMAL LOW (ref 15–41)
Albumin: 4.3 g/dL (ref 3.5–5.0)
Alkaline Phosphatase: 48 U/L (ref 38–126)
Anion gap: 8 (ref 5–15)
BUN: 16 mg/dL (ref 6–20)
CO2: 28 mmol/L (ref 22–32)
Calcium: 9.6 mg/dL (ref 8.9–10.3)
Chloride: 104 mmol/L (ref 98–111)
Creatinine: 0.58 mg/dL (ref 0.44–1.00)
GFR, Estimated: >60 mL/min
Glucose, Bld: 98 mg/dL (ref 70–99)
Potassium: 4.3 mmol/L (ref 3.5–5.1)
Sodium: 140 mmol/L (ref 135–145)
Total Bilirubin: 0.7 mg/dL (ref 0.3–1.2)
Total Protein: 8.1 g/dL (ref 6.5–8.1)

## 2021-11-09 LAB — SAVE SMEAR(SSMR), FOR PROVIDER SLIDE REVIEW

## 2021-11-09 LAB — LACTATE DEHYDROGENASE: LDH: 134 U/L (ref 98–192)

## 2021-11-09 NOTE — Telephone Encounter (Signed)
Per 11/09/21 los - gave upcoming appointments - confirmed

## 2021-11-09 NOTE — Progress Notes (Signed)
Hematology and Oncology Follow Up Visit  Victoria [name] [id number] 1971/04/09 51 y.o. 11/09/2021   Principle Diagnosis:  Chronic Myeloid Leukemia -- Relapsed  Current Therapy:   Scemblix 40 mg po q day -- changed on 11/09/2021     Interim History:  Victoria [name] is back for a follow-up.  She now is on Scemblix.  She is doing okay with Scemblix although there is still some toxicity with the Scemblix.  She is having some diarrhea.  We will decrease the Scemblix dose down to 40 mg a day right now.  Her last BCR/ABL was 108%.  She has had no cough.  She has had no nausea or vomiting.  She is trying to watch what she eats.  She has had no rashes.  She has had no problems with COVID.  There is been no headache.  Overall, I would say performance status is probably ECOG 1.    Medications:  Current Outpatient Medications:    Asciminib HCl (SCEMBLIX) 40 MG TABS, Take two tablets (80 mg) by mouth daily. Take on an empty stomach at least 2 hours before or 1 hour after food., Disp: 60 tablet, Rfl: 6  Allergies: No Known Allergies  Past Medical History, Surgical history, Social history, and Family History were reviewed and updated.  Review of Systems: Review of Systems  Constitutional: Negative.   HENT:  Negative.    Eyes: Negative.   Respiratory: Negative.    Cardiovascular: Negative.   Gastrointestinal: Negative.   Endocrine: Negative.   Genitourinary: Negative.    Musculoskeletal: Negative.   Skin: Negative.   Neurological: Negative.   Hematological: Negative.   Psychiatric/Behavioral: Negative.     Physical Exam:  height is 5' 4.96" (1.65 m) and weight is 196 lb 1.9 oz (89 kg). Her oral temperature is 98.2 F (36.8 C). Her blood pressure is 158/95 (abnormal) and her pulse is 85. Her respiration is 18 and oxygen saturation is 100%.   Wt Readings from Last 3 Encounters:  11/09/21 196 lb 1.9 oz (89 kg)  02/28/21 208 lb 3.2 oz (94.4 kg)  02/24/20 208 lb 12 oz (94.7 kg)     Physical Exam Vitals reviewed.  HENT:     Head: Normocephalic and atraumatic.  Eyes:     Pupils: Pupils are equal, round, and reactive to light.  Cardiovascular:     Rate and Rhythm: Normal rate and regular rhythm.     Heart sounds: Normal heart sounds.  Pulmonary:     Effort: Pulmonary effort is normal.     Breath sounds: Normal breath sounds.  Abdominal:     General: Bowel sounds are normal.     Palpations: Abdomen is soft.  Musculoskeletal:        General: No tenderness or deformity. Normal range of motion.     Cervical back: Normal range of motion.  Lymphadenopathy:     Cervical: No cervical adenopathy.  Skin:    General: Skin is warm and dry.     Findings: No erythema or rash.  Neurological:     Mental Status: She is alert and oriented to person, place, and time.  Psychiatric:        Behavior: Behavior normal.        Thought Content: Thought content normal.        Judgment: Judgment normal.     Lab Results  Component Value Date   WBC 5.8 11/09/2021   HGB 13.6 11/09/2021   HCT 40.1 11/09/2021   MCV 87.6  11/09/2021   PLT 182 11/09/2021     Chemistry      Component Value Date/Time   NA 140 02/28/2021 0815   NA 132 06/26/2016 1110   K 4.7 02/28/2021 0815   K 3.7 06/26/2016 1110   CL 104 02/28/2021 0815   CL 102 06/26/2016 1110   CO2 27 02/28/2021 0815   CO2 29 06/26/2016 1110   BUN 20 02/28/2021 0815   BUN 17 06/26/2016 1110   CREATININE 0.74 02/28/2021 0815   CREATININE 0.58 02/24/2020 1009   CREATININE 0.2 (L) 06/26/2016 1110      Component Value Date/Time   CALCIUM 9.9 02/28/2021 0815   CALCIUM 9.4 06/26/2016 1110   ALKPHOS 64 02/28/2021 0815   ALKPHOS 88 (H) 06/26/2016 1110   AST 41 02/28/2021 0815   AST 24 02/24/2020 1009   ALT 27 02/28/2021 0815   ALT 24 02/24/2020 1009   ALT 42 06/26/2016 1110   BILITOT 0.5 02/28/2021 0815   BILITOT 0.4 02/24/2020 1009       Impression and Plan: Victoria [name] is a 51 year-old white female with CML.   She has had a long history of CML.  I think she has had it for at least 12 years.   I) sure that the Scemblix is working well.  She has a marked decrease in her white blood cells.  I looked at her blood smear.  The white blood cells look mature.  I do not see any blasts.  There is no immature myeloid cells.  We will decrease the dose of the Scemblix to 40 mg a day.  Hopefully, this will help mitigate some toxicity for her.  I know that she is having issues with this category of drugs.  I know it is hard for her to work because of toxicity of medications.  I would like to see her back in 6 months.  I am sure that the BCR/ABL is down nicely.     Volanda Napoleon, MD 2/15/20238:08 AM

## 2021-11-10 ENCOUNTER — Telehealth: Payer: Self-pay | Admitting: *Deleted

## 2021-11-10 NOTE — Telephone Encounter (Signed)
Received paperwork from [facility] for medical records - sent paperwork through intraoffice mail to [place]

## 2021-11-22 LAB — BCR/ABL

## 2022-05-09 ENCOUNTER — Inpatient Hospital Stay: Payer: Self-pay | Admitting: Hematology & Oncology

## 2022-05-09 ENCOUNTER — Inpatient Hospital Stay: Payer: Self-pay | Attending: Hematology & Oncology

## 2023-02-13 ENCOUNTER — Encounter (HOSPITAL_COMMUNITY): Payer: Self-pay | Admitting: Family Medicine

## 2023-02-13 ENCOUNTER — Emergency Department (HOSPITAL_BASED_OUTPATIENT_CLINIC_OR_DEPARTMENT_OTHER): Payer: Medicaid Other

## 2023-02-13 ENCOUNTER — Inpatient Hospital Stay (HOSPITAL_BASED_OUTPATIENT_CLINIC_OR_DEPARTMENT_OTHER)
Admission: EM | Admit: 2023-02-13 | Discharge: 2023-02-16 | DRG: 842 | Disposition: A | Discharge status: Home / Self Care | Payer: Medicaid Other | Attending: Internal Medicine | Admitting: Internal Medicine

## 2023-02-13 ENCOUNTER — Other Ambulatory Visit: Payer: Self-pay

## 2023-02-13 ENCOUNTER — Inpatient Hospital Stay (HOSPITAL_COMMUNITY): Payer: Medicaid Other

## 2023-02-13 DIAGNOSIS — D630 Anemia in neoplastic disease: Secondary | ICD-10-CM | POA: Diagnosis present

## 2023-02-13 DIAGNOSIS — Z91148 Patient's other noncompliance with medication regimen for other reason: Secondary | ICD-10-CM

## 2023-02-13 DIAGNOSIS — R161 Splenomegaly, not elsewhere classified: Secondary | ICD-10-CM | POA: Diagnosis present

## 2023-02-13 DIAGNOSIS — R000 Tachycardia, unspecified: Secondary | ICD-10-CM | POA: Diagnosis not present

## 2023-02-13 DIAGNOSIS — R0602 Shortness of breath: Secondary | ICD-10-CM | POA: Diagnosis not present

## 2023-02-13 DIAGNOSIS — C9210 Chronic myeloid leukemia, BCR/ABL-positive, not having achieved remission: Secondary | ICD-10-CM | POA: Diagnosis not present

## 2023-02-13 DIAGNOSIS — C9212 Chronic myeloid leukemia, BCR/ABL-positive, in relapse: Secondary | ICD-10-CM | POA: Diagnosis present

## 2023-02-13 DIAGNOSIS — D696 Thrombocytopenia, unspecified: Secondary | ICD-10-CM | POA: Diagnosis not present

## 2023-02-13 DIAGNOSIS — D649 Anemia, unspecified: Secondary | ICD-10-CM | POA: Diagnosis present

## 2023-02-13 LAB — COMPREHENSIVE METABOLIC PANEL
ALT: 13 U/L (ref 0–44)
AST: 24 U/L (ref 15–41)
Albumin: 3.5 g/dL (ref 3.5–5.0)
Alkaline Phosphatase: 82 U/L (ref 38–126)
Anion gap: 12 (ref 5–15)
BUN: 21 mg/dL — ABNORMAL HIGH (ref 6–20)
CO2: 19 mmol/L — ABNORMAL LOW (ref 22–32)
Calcium: 8 mg/dL — ABNORMAL LOW (ref 8.9–10.3)
Chloride: 99 mmol/L (ref 98–111)
Creatinine, Ser: 0.77 mg/dL (ref 0.44–1.00)
GFR, Estimated: >60 mL/min (ref >60)
Glucose, Bld: 116 mg/dL — ABNORMAL HIGH (ref 70–99)
Potassium: 4.1 mmol/L (ref 3.5–5.1)
Sodium: 130 mmol/L — ABNORMAL LOW (ref 135–145)
Total Bilirubin: 0.6 mg/dL (ref 0.3–1.2)
Total Protein: 7.3 g/dL (ref 6.5–8.1)

## 2023-02-13 LAB — URINALYSIS, ROUTINE W REFLEX MICROSCOPIC
Bilirubin Urine: NEGATIVE
Glucose, UA: NEGATIVE mg/dL
Ketones, ur: NEGATIVE mg/dL
Leukocytes,Ua: NEGATIVE
Nitrite: NEGATIVE
Protein, ur: NEGATIVE mg/dL
Specific Gravity, Urine: 1.01 (ref 1.005–1.030)
pH: 6.5 (ref 5.0–8.0)

## 2023-02-13 LAB — CBC
HCT: 14.7 % — ABNORMAL LOW (ref 36.0–46.0)
Hemoglobin: 5 g/dL — CL (ref 12.0–15.0)
MCH: 29.2 pg (ref 26.0–34.0)
MCHC: 34 g/dL (ref 30.0–36.0)
MCV: 86 fL (ref 80.0–100.0)
Platelets: 31 10*3/uL — ABNORMAL LOW (ref 150–400)
RBC: 1.71 MIL/uL — ABNORMAL LOW (ref 3.87–5.11)
RDW: 17.6 % — ABNORMAL HIGH (ref 11.5–15.5)
WBC: 52.9 10*3/uL (ref 4.0–10.5)
nRBC: 0 % (ref 0.0–0.2)

## 2023-02-13 LAB — DIFFERENTIAL
Immature Granulocytes: 23 %
Monocytes Absolute: 10.1 10*3/uL — ABNORMAL HIGH (ref 0.1–1.0)
Neutrophils Relative %: 39 %

## 2023-02-13 LAB — URINALYSIS, MICROSCOPIC (REFLEX)

## 2023-02-13 LAB — LIPASE, BLOOD: Lipase: 126 U/L — ABNORMAL HIGH (ref 11–51)

## 2023-02-13 LAB — PREPARE RBC (CROSSMATCH)

## 2023-02-13 MED ORDER — SODIUM CHLORIDE 0.9% IV SOLUTION: Freq: Once | INTRAVENOUS | Status: AC

## 2023-02-13 MED ORDER — ALBUTEROL SULFATE (2.5 MG/3ML) 0.083% IN NEBU: 2.5000 mg | INHALATION_SOLUTION | RESPIRATORY_TRACT | Status: DC | PRN

## 2023-02-13 MED ORDER — ALBUTEROL SULFATE HFA 108 (90 BASE) MCG/ACT IN AERS: 2.0000 | INHALATION_SPRAY | RESPIRATORY_TRACT | Status: DC | PRN

## 2023-02-13 MED ORDER — SODIUM CHLORIDE 0.9 % IV SOLN: INTRAVENOUS | Status: AC

## 2023-02-13 NOTE — ED Notes (Addendum)
Pt declining Chest xray, MD aware

## 2023-02-13 NOTE — H&P (Signed)
History and Physical    Patient: Victoria [name]:[id number] DOB: May 14, 1971 DOA: 02/13/2023 DOS: the patient was seen and examined on 02/13/2023 PCP: Patient, No Pcp Per  Patient coming from: Home  Chief Complaint:  Chief Complaint  Patient presents with   Shortness of Breath   Dizziness   HPI: Victoria [name] is a 52 y.o. female with medical history significant of CML who presents with dizziness, shortness of breath, fatigue.   Symptoms started earlier this week but thinking back pt thinks might have been longer. Reports feeling short of breath worse with exertion and dizzy. Also has slight left sided abdominal side pain radiating to left shoulder. Also has new cough. No fever. Had a family trauma this week and attributed symptoms to that. Today could barely get across a room due to her symptoms so decided to go to ED. Denies any nausea, vomiting or diarrhea. Denies any bleeding or dark stool. No longer has menstrual cycles.  She denies other significant medical problems except for CML. Last saw Dr. Myna Hidalgo with oncology 10/2021 but was lost to follow up due to a divorce. Stopped taking her Scemblix since August 2023.    In the ED, she was temperature up to 100.21F tachycardic and required 2L via [place].   Had WBC of 52K with moderate left shift and absolute lymphocytosis. Hgb of 5 down from baseline around 12 a year ago. Plt of 31.  Na of 130, K of 4.1, creatinine of 0.77.  CXR ordered but pt refused.   ED consulted oncology Dr. Pamelia Hoit who recommends transfusion and supportive care. Will have Dr. Myna Hidalgo who is her primary follow in the morning.     Review of Systems: As mentioned in the history of present illness. All other systems reviewed and are negative. Past Medical History:  Diagnosis Date   Anemia    HX   Anxiety    no meds   Chronic myelogenous leukemia (HCC) 2010   Depression    no meds   PONV (postoperative nausea and vomiting)    Past Surgical History:   Procedure Laterality Date   BREAST SURGERY     reduction   CHOLECYSTECTOMY     DILATATION & CURRETTAGE/HYSTEROSCOPY WITH RESECTOCOPE N/A 10/27/2013   Procedure: DILATATION & CURETTAGE/HYSTEROSCOPY WITH RESECTOCOPE;  Surgeon: Genia Del, MD;  Location: [facility];  Service: Gynecology;  Laterality: N/A;   DILATION AND CURETTAGE OF UTERUS     TAB   Social History:  reports that she has never smoked. She has never used smokeless tobacco. She reports current alcohol use. She reports that she does not use drugs.  No Known Allergies No pertinent family hx  Prior to Admission medications   Medication Sig Start Date End Date Taking? Authorizing Provider  Asciminib HCl (SCEMBLIX) 40 MG TABS Take two tablets (80 mg) by mouth daily. Take on an empty stomach at least 2 hours before or 1 hour after food. 03/02/21   Josph Macho, MD    Physical Exam: Vitals:   02/13/23 1800 02/13/23 1810 02/13/23 2015 02/13/23 2111  BP: 123/75  119/67 115/66  Pulse: (!) 106  (!) 101 (!) 103  Resp: 18   20  Temp:    98.6 F (37 C)  TempSrc:    Oral  SpO2: 100% 100% 100% 100%  Weight:       Constitutional: NAD, calm, comfortable, non-toxic appearing middle age female sitting upright in bed Eyes: lids and conjunctivae normal ENMT: Mucous membranes are moist.  Neck: normal, supple Respiratory: clear to auscultation bilaterally, no wheezing, no crackles. Normal respiratory effort. No accessory muscle use.  Cardiovascular: Regular rate and rhythm, no murmurs / rubs / gallops. No extremity edema.  Abdomen: soft, non-distended, no tenderness,  Musculoskeletal: no clubbing / cyanosis. No joint deformity upper and lower extremities.  Skin: no rashes, lesions, ulcers. No induration Neurologic: CN 2-12 grossly intact. Strength 5/5 in all 4.  Psychiatric: Normal judgment and insight. Alert and oriented x 3. Normal mood.   Data Reviewed:  See HPI  Assessment and Plan: * Acute anemia -Hgb of 5 with remote  baseline of 12. Denies any bleeding, melena, no menstrual cycles. Likely due to worsening of untreated CML. -Transfuse 2 units pRBC with goal of Hgb >7. Check post transfusion CBC.   CML (chronic myelocytic leukemia) (HCC) -pt followed with oncology Dr. Myna Hidalgo but stopped follow up due to life stressors. Has stopped taking Scemblix since August 2023 which is most likely cause of her worsening CML -Today presents with WBC of 52 with left shift, Hgb of 5, and platelet of 31 -On call oncologist Dr. Pamelia Hoit consulted and recommends transfusion and symptomatic care and they will full consult in the morning  Thrombocytopenia (HCC) -Plt of 31. No active signs of bleeding -continue to monitor      Advance Care Planning:   Code Status: Full Code   Consults: oncology  Family Communication: none at bedside  Severity of Illness: The appropriate patient status for this patient is INPATIENT. Inpatient status is judged to be reasonable and necessary in order to provide the required intensity of service to ensure the patient's safety. The patient's presenting symptoms, physical exam findings, and initial radiographic and laboratory data in the context of their chronic comorbidities is felt to place them at high risk for further clinical deterioration. Furthermore, it is not anticipated that the patient will be medically stable for discharge from the hospital within 2 midnights of admission.   * I certify that at the point of admission it is my clinical judgment that the patient will require inpatient hospital care spanning beyond 2 midnights from the point of admission due to high intensity of service, high risk for further deterioration and high frequency of surveillance required.*  Author: Anselm Jungling, DO 02/13/2023 10:09 PM  For on call review [email or web address].

## 2023-02-13 NOTE — ED Notes (Signed)
Report given to floor RN

## 2023-02-13 NOTE — ED Triage Notes (Signed)
Cancer patient , last chemo orally last month .  Reports shortness of breath x 1 week , coughing x 1 week . Adds feeling dizzy x 1 week . Lethargic .  Adds just had a family tragedy 3 days ago   her shortness of breath been worse .

## 2023-02-13 NOTE — ED Notes (Signed)
   02/13/23 1810  Oxygen Therapy/Pulse Ox  O2 Device Nasal Cannula  O2 Flow Rate (L/min) 2 L/min  SpO2 100 %   Placed on 2lpm [place] for O2 delivery

## 2023-02-13 NOTE — ED Notes (Signed)
Date and time results received: 02/13/23 5:47 PM   Test: Hgb 5 & WBU 52.8  Name of Provider Notified: Dr. Adela Lank

## 2023-02-13 NOTE — Progress Notes (Signed)
Plan of Care Note for accepted transfer   Patient: Victoria [name] MRN: [id number]   DOA: 02/13/2023  Facility requesting transfer: [facility]  Requesting Provider: Dr. 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 Also had 30lb wegith loss.   WBC of 52, Hgb of 5, Plt of 31. No active bleeding.  Na of 130, Glucose of 116, Cr of 0.77.   Otherwise, hemodynamically stable, tachycardic and requiring 2L.   ED discussed with oncology Dr. Pamelia Hoit who recommends normal transfusion protocol and has message Dr. Myna Hidalgo for consultation in the morning.  Will need transfusion on arrival.   Plan of care: The patient is accepted for admission to Progressive unit, at [facility]  EDP to continue care of pt while she remains in ED.   Author: Anselm Jungling, DO 02/13/2023  Check [email or web address] for on-call coverage.  Nursing staff, Please call [facility] Admits & Consults System-Wide number on Amion as soon as patient's arrival, so appropriate admitting provider can evaluate the pt.

## 2023-02-13 NOTE — Assessment & Plan Note (Signed)
-  pt followed with oncology Dr. Myna Hidalgo but stopped follow up due to life stressors. Has stopped taking Scemblix since August 2023 which is most likely cause of her worsening CML -Today presents with WBC of 52 with left shift, Hgb of 5, and platelet of 31 -On call oncologist Dr. Pamelia Hoit consulted and recommends transfusion and symptomatic care and they will full consult in the morning

## 2023-02-13 NOTE — ED Notes (Signed)
   02/13/23 1701  Respiratory Assessment  Assessment Type Assess only  Respiratory Pattern Regular;Symmetrical;Tachypnea  Chest Assessment Chest expansion symmetrical  Bilateral Breath Sounds Clear  Oxygen Therapy/Pulse Ox  O2 Therapy Room air  SpO2 100 %   Seen in triage, BBS CTA, anxious, death in family this week.  Spo2 100% om r/a

## 2023-02-13 NOTE — ED Provider Notes (Signed)
Victoria [name]   CSN: [id number] Arrival date & time: 02/13/23  1654     History  Chief Complaint  Patient presents with   Shortness of Breath   Dizziness    Victoria [name] is a 52 y.o. female.  Patient says she had URI symptoms on Tuesday. Says she was sick months ago and felt some respiratory symptoms persisting but got worse on Tuesday. Has had sob since and gets winded just going to the bathroom, abnormal for her. Has had a cough as well. Denies CP, pleuritic CP, LE edema, N/V, abdominal pain, diarrhea, melena, hematochezia. Reports dizziness as well that starts with standing and lasts up to minutes until resting. Has not seen oncologist in over a year. Has not taking chemotherapeutic in a long time. Has had some weight loss as well.   Shortness of Breath Dizziness Associated symptoms: shortness of breath        Home Medications Prior to Admission medications   Medication Sig Start Date End Date Taking? Authorizing Provider  Asciminib HCl (SCEMBLIX) 40 MG TABS Take two tablets (80 mg) by mouth daily. Take on an empty stomach at least 2 hours before or 1 hour after food. 03/02/21   Josph Macho, MD      Allergies    Patient has no known allergies.    Review of Systems   Review of Systems  Respiratory:  Positive for shortness of breath.   Neurological:  Positive for dizziness.  All other systems reviewed and are negative.   Physical Exam Updated Vital Signs BP 107/82 (BP Location: Left Arm)   Pulse (!) 126   Temp 100.2 F (37.9 C)   Resp 20   Wt 54 kg   LMP  (Exact Date)   SpO2 99%   BMI 19.83 kg/m  Physical Exam Vitals reviewed.  Constitutional:      General: She is not in acute distress.    Appearance: She is normal weight. She is not ill-appearing.  Eyes:     Extraocular Movements: Extraocular movements intact.     Pupils: Pupils are equal, round, and reactive to light.     Comments:  Conjunctival pallor  Cardiovascular:     Rate and Rhythm: Regular rhythm. Tachycardia present.     Pulses: Normal pulses.     Heart sounds: Normal heart sounds. No murmur heard. Pulmonary:     Effort: Pulmonary effort is normal. No tachypnea or respiratory distress.     Breath sounds: Normal breath sounds.  Abdominal:     General: Bowel sounds are normal.     Palpations: Abdomen is soft.     Tenderness: There is no abdominal tenderness.  Musculoskeletal:     Right lower leg: No edema.     Left lower leg: No edema.  Skin:    General: Skin is warm and dry.  Neurological:     General: No focal deficit present.     Mental Status: She is alert.     ED Results / Procedures / Treatments   Labs (all labs ordered are listed, but only abnormal results are displayed) Labs Reviewed  LIPASE, BLOOD  COMPREHENSIVE METABOLIC PANEL  CBC  URINALYSIS, ROUTINE W REFLEX MICROSCOPIC    EKG EKG Interpretation  Date/Time:  Tuesday Feb 13 2023 17:06:13 EDT Ventricular Rate:  118 PR Interval:  135 QRS Duration: 89 QT Interval:  373 QTC Calculation: 523 R Axis:   86 Text Interpretation: Sinus tachycardia  qt manually calculated normal No old tracing to compare Confirmed by Melene Plan [phone]) on 02/13/2023 5:35:02 PM  Radiology No results found.  Procedures Procedures    Medications Ordered in ED Medications  albuterol (VENTOLIN HFA) 108 (90 Base) MCG/ACT inhaler 2 puff (has no administration in time range)    ED Course/ Medical Decision Making/ A&P                             Medical Decision Making Amount and/or Complexity of Data Reviewed Labs: ordered. Radiology: ordered.  Risk Prescription drug management. Decision regarding hospitalization.   Patient with history of CML and not on chemotherapeutic or had recent onc follow up presents with dizziness , sob, fatigue and weight loss. Afebrile, HDS and tachycardic. CBC with WBC to 52.9 , normocytic anemia to 5, platelets to  31. Appears patient is having active CML. No evidence of blood loss, no elevation of bili to suggest hemolysis, suspect anemia is from marrow crowding. Given no bleeding, indication for platelet transfusion for is <10k. Do not suspect this is PE at this time as there is a more likely cause of her symptoms. Do not suspect dizziness is a primary central or peripheral neurologic symptom as she has no focal deficits and it is entirely positional with known anemia. Patient does not wish to have cxr given lack of insurance, but afebrile, lctab, do not suspect pneumonia at this time.Overall symptoms consistent with active cml and symptomatic anemia. Will need to admit. Contacted onc, said to continue with transfusions and supportive care. Dr. Myna Hidalgo to see tomorrow.  Update: cbc differential with  20.8 absolute neutrophils and moderate left shift with >5% metamyelocytes and myelocytes. Signed patient out with triad hospitalist at [facility] long for admission.   Final Clinical Impression(s) / ED Diagnoses Final diagnoses:  None    Rx / DC Orders ED Discharge Orders     None         Willette Cluster, MD 02/13/23 2129    Melene Plan, DO 02/13/23 2143

## 2023-02-13 NOTE — ED Notes (Addendum)
Called lab, they can add the Diff onto the existing lab tubes, no redraw needed

## 2023-02-13 NOTE — Assessment & Plan Note (Signed)
-  Plt of 31. No active signs of bleeding -continue to monitor

## 2023-02-13 NOTE — ED Notes (Signed)
Called [facility] for transport spoke with [employer]

## 2023-02-13 NOTE — ED Notes (Signed)
Pt states she just used the restroom and cannot go again, gave her the urine cup and will retry soon

## 2023-02-13 NOTE — Assessment & Plan Note (Signed)
-  Hgb of 5 with remote baseline of 12. Denies any bleeding, melena, no menstrual cycles. Likely due to worsening of untreated CML. -Transfuse 2 units pRBC with goal of Hgb >7. Check post transfusion CBC.

## 2023-02-14 ENCOUNTER — Inpatient Hospital Stay (HOSPITAL_COMMUNITY): Payer: Medicaid Other

## 2023-02-14 DIAGNOSIS — D649 Anemia, unspecified: Secondary | ICD-10-CM

## 2023-02-14 LAB — BASIC METABOLIC PANEL
Anion gap: 7 (ref 5–15)
BUN: 16 mg/dL (ref 6–20)
CO2: 21 mmol/L — ABNORMAL LOW (ref 22–32)
Calcium: 7.5 mg/dL — ABNORMAL LOW (ref 8.9–10.3)
Chloride: 106 mmol/L (ref 98–111)
Creatinine, Ser: 0.54 mg/dL (ref 0.44–1.00)
GFR, Estimated: >60 mL/min (ref >60)
Glucose, Bld: 97 mg/dL (ref 70–99)
Potassium: 3.8 mmol/L (ref 3.5–5.1)
Sodium: 134 mmol/L — ABNORMAL LOW (ref 135–145)

## 2023-02-14 LAB — CBC WITH DIFFERENTIAL/PLATELET
Abs Immature Granulocytes: 2 10*3/uL — ABNORMAL HIGH (ref 0.00–0.07)
Band Neutrophils: 5 %
Basophils Absolute: 1 10*3/uL — ABNORMAL HIGH (ref 0.0–0.1)
Basophils Relative: 3 %
Blasts: 9 %
Eosinophils Absolute: 0.3 10*3/uL (ref 0.0–0.5)
Eosinophils Relative: 1 %
HCT: 21 % — ABNORMAL LOW (ref 36.0–46.0)
Hemoglobin: 7 g/dL — ABNORMAL LOW (ref 12.0–15.0)
Lymphocytes Relative: 8 %
Lymphs Abs: 2.6 10*3/uL (ref 0.7–4.0)
MCH: 28.9 pg (ref 26.0–34.0)
MCHC: 33.3 g/dL (ref 30.0–36.0)
MCV: 86.8 fL (ref 80.0–100.0)
Metamyelocytes Relative: 1 %
Monocytes Absolute: 2.3 10*3/uL — ABNORMAL HIGH (ref 0.1–1.0)
Monocytes Relative: 7 %
Myelocytes: 5 %
Neutro Abs: 21.8 10*3/uL — ABNORMAL HIGH (ref 1.7–7.7)
Neutrophils Relative %: 61 %
Platelets: 21 10*3/uL — CL (ref 150–400)
RBC: 2.42 MIL/uL — ABNORMAL LOW (ref 3.87–5.11)
RDW: 15.9 % — ABNORMAL HIGH (ref 11.5–15.5)
WBC: 33.1 10*3/uL — ABNORMAL HIGH (ref 4.0–10.5)
nRBC: 0 % (ref 0.0–0.2)

## 2023-02-14 LAB — HIV ANTIBODY (ROUTINE TESTING W REFLEX): HIV Screen 4th Generation wRfx: NONREACTIVE

## 2023-02-14 LAB — CBC
HCT: 24 % — ABNORMAL LOW (ref 36.0–46.0)
Hemoglobin: 7.9 g/dL — ABNORMAL LOW (ref 12.0–15.0)
MCH: 28.9 pg (ref 26.0–34.0)
MCHC: 32.9 g/dL (ref 30.0–36.0)
MCV: 87.9 fL (ref 80.0–100.0)
Platelets: 18 10*3/uL — CL (ref 150–400)
RBC: 2.73 MIL/uL — ABNORMAL LOW (ref 3.87–5.11)
RDW: 15.8 % — ABNORMAL HIGH (ref 11.5–15.5)
WBC: 31.9 10*3/uL — ABNORMAL HIGH (ref 4.0–10.5)
nRBC: 0 % (ref 0.0–0.2)

## 2023-02-14 LAB — TYPE AND SCREEN

## 2023-02-14 LAB — RETICULOCYTES
Immature Retic Fract: 1.9 % — ABNORMAL LOW (ref 2.3–15.9)
RBC.: 2.39 MIL/uL — ABNORMAL LOW (ref 3.87–5.11)
Retic Count, Absolute: 11.5 10*3/uL — ABNORMAL LOW (ref 19.0–186.0)
Retic Ct Pct: 0.5 % (ref 0.4–3.1)

## 2023-02-14 LAB — PATHOLOGIST SMEAR REVIEW

## 2023-02-14 LAB — IRON AND TIBC
Iron: 191 ug/dL — ABNORMAL HIGH (ref 28–170)
Saturation Ratios: 76 % — ABNORMAL HIGH (ref 10.4–31.8)
TIBC: 252 ug/dL (ref 250–450)
UIBC: 61 ug/dL

## 2023-02-14 LAB — ABO/RH: ABO/RH(D): B POS

## 2023-02-14 LAB — PREPARE RBC (CROSSMATCH)

## 2023-02-14 LAB — LACTATE DEHYDROGENASE: LDH: 522 U/L — ABNORMAL HIGH (ref 98–192)

## 2023-02-14 LAB — BPAM RBC: ISSUE DATE / TIME: 202405220030

## 2023-02-14 LAB — VITAMIN B12: Vitamin B-12: 3055 pg/mL — ABNORMAL HIGH (ref 180–914)

## 2023-02-14 MED ORDER — SODIUM CHLORIDE 0.9% IV SOLUTION: Freq: Once | INTRAVENOUS | Status: DC

## 2023-02-14 NOTE — Consult Note (Signed)
Referral MD  Reason for Referral: Chronic myeloid leukemia; severe anemia  Chief Complaint  Patient presents with   Shortness of Breath   Dizziness  : I just got weak and had a hard time walking.  HPI: Ms. Casasanta is well-known to me.  She is a very charming 52 year old white female.  I have been following her now for probably about 15 years.  She has CML that has been under fairly good control when she takes her medications.  She had had problems in the past with insurance and trying to get medications.  She has been on Scemblix.  This had been working.  Her last BCR/ABL which was back in I think a year ago was down to 54%.  Unfortunately, she been under incredible stress.  Back in August, she had her marriage break-up.  This was incredibly difficult for her.  She subsequently stopped taking her medications.  She recently had a lot of stress with a friend who had a son who passed away unexpectedly.  She has been getting weaker.  She has been having little bit harder time walking because of weakness.  She did and has had no problems with bleeding.  There is been no change in her diet.  She is not a vegetarian.  She subsequently came to the ER at [facility] yesterday.  She had lab work done that showed a white count of 52.9.  Hemoglobin 5.  Platelet count 31,000.  MCV is 86.  When we had seen her a year ago she had normal blood counts.  Her sodium was 130.  Potassium 4.1.  BUN 21 creatinine 0.77.  Calcium 8 with an albumin of 3.5.  Lipase was elevated at 126.  She was subsequently admitted.  She had been given some blood.  She has had no diarrhea.  She has had no rashes.  There is been no bleeding.  She does not have a monthly cycle.  There is been no cough or shortness of breath.  She has lost some weight.  There is been no problems with COVID.  Overall, I would say performance status is probably ECOG 1.   Past Medical History:  Diagnosis Date   Anemia    HX    Anxiety    no meds   Chronic myelogenous leukemia (HCC) 2010   Depression    no meds   PONV (postoperative nausea and vomiting)   :   Past Surgical History:  Procedure Laterality Date   BREAST SURGERY     reduction   CHOLECYSTECTOMY     DILATATION & CURRETTAGE/HYSTEROSCOPY WITH RESECTOCOPE N/A 10/27/2013   Procedure: DILATATION & CURETTAGE/HYSTEROSCOPY WITH RESECTOCOPE;  Surgeon: Genia Del, MD;  Location: [facility];  Service: Gynecology;  Laterality: N/A;   DILATION AND CURETTAGE OF UTERUS     TAB  :   Current Facility-Administered Medications:    0.9 %  sodium chloride infusion, , Intravenous, Continuous, Tu, Ching T, DO, Last Rate: 75 mL/hr at 02/14/23 0544, New Bag at 02/14/23 0544   albuterol (PROVENTIL) (2.5 MG/3ML) 0.083% nebulizer solution 2.5 mg, 2.5 mg, Nebulization, Q2H PRN, Ellington, Abby K, RPH:  :  No Known Allergies:  History reviewed. No pertinent family history.:   Social History   Socioeconomic History   Marital status: Married    Spouse name: Not on file   Number of children: Not on file   Years of education: Not on file   Highest education level: Not on file  Occupational History   Not on file  Tobacco Use   Smoking status: Never   Smokeless tobacco: Never  Vaping Use   Vaping Use: Never used  Substance and Sexual Activity   Alcohol use: Yes    Alcohol/week: 0.0 standard drinks of alcohol    Comment: social   Drug use: No   Sexual activity: Yes    Birth control/protection: None  Other Topics Concern   Not on file  Social History Narrative   Not on file   Social Determinants of Health   Financial Resource Strain: Not on file  Food Insecurity: No Food Insecurity (02/13/2023)   Hunger Vital Sign    Worried About Running Out of Food in the Last Year: Never true    Ran Out of Food in the Last Year: Never true  Transportation Needs: No Transportation Needs (02/13/2023)   PRAPARE - Administrator, Civil Service (Medical): No     Lack of Transportation (Non-Medical): No  Physical Activity: Not on file  Stress: Not on file  Social Connections: Not on file  Intimate Partner Violence: Not At Risk (02/13/2023)   Humiliation, Afraid, Rape, and Kick questionnaire    Fear of Current or Ex-Partner: No    Emotionally Abused: No    Physically Abused: No    Sexually Abused: No  :  Pertinent items are noted in HPI.  Exam: Patient Vitals for the past 24 hrs:  BP Temp Temp src Pulse Resp SpO2 Height Weight  02/14/23 0538 126/75 98.5 F (36.9 C) Oral 89 18 100 % -- --  02/14/23 0338 99/61 99.3 F (37.4 C) Oral 88 20 100 % -- --  02/14/23 0335 99/61 99.3 F (37.4 C) -- 88 20 -- -- --  02/14/23 0320 115/66 99.3 F (37.4 C) Oral 88 20 100 % -- --  02/14/23 0319 115/66 99.3 F (37.4 C) Oral 88 -- 100 % -- --  02/14/23 0247 120/62 99.2 F (37.3 C) Oral 93 20 98 % -- --  02/14/23 0245 120/62 99.2 F (37.3 C) Oral 93 20 100 % -- --  02/14/23 0056 109/63 99 F (37.2 C) Oral 93 20 100 % -- --  02/14/23 0053 109/63 99 F (37.2 C) -- 94 20 -- -- --  02/14/23 0025 114/63 99.5 F (37.5 C) Oral 96 20 100 % -- --  02/13/23 2200 -- -- -- -- -- -- 5\' 5"  (1.651 m) 121 lb 4.1 oz (55 kg)  02/13/23 2111 115/66 98.6 F (37 C) Oral (!) 103 20 100 % -- --  02/13/23 2015 119/67 -- -- (!) 101 -- 100 % -- --  02/13/23 1810 -- -- -- -- -- 100 % -- --  02/13/23 1800 123/75 -- -- (!) 106 18 100 % -- --  02/13/23 1757 -- 99.4 F (37.4 C) Oral (!) 110 20 99 % -- --  02/13/23 1707 -- -- -- -- -- -- -- 119 lb (54 kg)  02/13/23 1701 107/82 100.2 F (37.9 C) -- (!) 126 20 99 % -- --  02/13/23 1701 -- -- -- -- -- 100 % -- --   Physical Exam Vitals reviewed.  HENT:     Head: Normocephalic and atraumatic.  Eyes:     Pupils: Pupils are equal, round, and reactive to light.  Cardiovascular:     Rate and Rhythm: Normal rate and regular rhythm.     Heart sounds: Normal heart sounds.  Pulmonary:     Effort: Pulmonary effort  is normal.      Breath sounds: Normal breath sounds.  Abdominal:     General: Bowel sounds are normal.     Palpations: Abdomen is soft.     Comments: Abdominal exam is soft.  Bowel sounds are present.  There is splenomegaly.  The spleen tip is probably about 7 or 8 cm below the left costal margin.  I really cannot palpate her liver edge.  Musculoskeletal:        General: No tenderness or deformity. Normal range of motion.     Cervical back: Normal range of motion.  Lymphadenopathy:     Cervical: No cervical adenopathy.  Skin:    General: Skin is warm and dry.     Findings: No erythema or rash.  Neurological:     Mental Status: She is alert and oriented to person, place, and time.  Psychiatric:        Behavior: Behavior normal.        Thought Content: Thought content normal.        Judgment: Judgment normal.     Recent Labs    02/13/23 1726  WBC 52.9*  HGB 5.0*  HCT 14.7*  PLT 31*    Recent Labs    02/13/23 1726  NA 130*  K 4.1  CL 99  CO2 19*  GLUCOSE 116*  BUN 21*  CREATININE 0.77  CALCIUM 8.0*    Blood smear review: None  Pathology: None    Assessment and Plan: Ms. Clarence is a very charming 52 year old white female with CML.  I really not surprised by the white cell count.  I am surprised by her platelets and her hemoglobin being so low.  I know that she was doing quite well on the Scemblix.  However, she had not been taking it for several months because of her personal life.  Unfortunately, the hospital does not have Scemblix in the formulary.  I am unsure if she has any at home.  I am going to check her iron studies.  I will check her vitamin B12 level.  I just want to make sure nothing else is going on with respect to her blood.  I am sure that her BCR/ABL is incredibly high.  We really have to get her up on a TKI drug.  This is a long way that would not be able to normalize her blood counts in my opinion.  I feel bad for her.  I know she is under a lot of stress.   I just hate the fact that she has not taken the Scemblix for quite a while.  It was working well.  I know that the Scemblix will work again and we can just get her back on it and have her take it.  Again we have to see what her labs look like.  I did look at the smear under the microscope.  I know that she will get incredible care from everybody on 4 E.    Christin Bach, MD  1 Remi Deter 2:2

## 2023-02-14 NOTE — TOC CM/SW Note (Signed)
  Transition of Care [facility]) Screening Note   Patient Details  Name: RIELYNN WALLES Date of Birth: 1971-02-17   [facility] CM/SW Contact:    Howell Rucks, RN Phone Number: 02/14/2023, 2:15 PM    Transition of Care [facility] has reviewed patient and no TOC needs have been identified at this time. We will continue to monitor patient advancement through interdisciplinary progression rounds. If new patient transition needs arise, please place a TOC consult.

## 2023-02-14 NOTE — Progress Notes (Signed)
PROGRESS NOTE  SHANNIKA WINKLER ZOX:[id number] DOB: [birth year] DOA: 02/13/2023 PCP: Patient, No Pcp Per  HPI/Recap of past 24 hours: Victoria [name] is a 52 y.o. female with medical history significant of CML who presents with dizziness, worsening shortness of breath worse with exertion, fatigue for a couple of weeks. Reports left sided abdominal side pain radiating to left shoulder. Also has new cough. No fever. Has been through a lot of stressors this past couple of weeks, her friend's son passed away unexpectedly, as well as a recent divorce.  Patient does have a history of CML and has been following Dr. Myna Hidalgo but was lost to follow-up and stopped taking her Scemblix since August 2023.  Patient denies any bleeding or dark stools and no longer has her menstrual cycle. In the ED, noted low grade temperature up to 100.24F, tachycardic and required 2L via [place]. WBC of 52K with moderate left shift and absolute lymphocytosis. Hgb of 5 down from baseline around 12 a year ago. Plt of 31. CXR ordered but pt refused.  Oncology Dr. Myna Hidalgo consulted.  Patient admitted for further management.    Today, patient denied any new complaints.  Still with some slight left-sided abdominal pain, reports overall improvement.    Assessment/Plan: Principal Problem:   Acute anemia Active Problems:   CML (chronic myelocytic leukemia) (HCC)   Thrombocytopenia (HCC)  Acute anemia possibly 2/2 untreated CML Presents with Hgb of 5 with remote baseline of 12 Denies any bleeding, melena, no menstrual cycles Transfused 2 units pRBC with hgb now at 7, will give another 1U of PRBC on 5/22 Daily CBC  Thrombocytopenia (HCC) Noted splenomegaly Platelet downtrending Abdominal ultrasound showed enlarged spleen measuring 24 x 10 x 17 Continue to monitor, transfuse platelets if bleeding or less than 10,000 Daily CBC   CML (chronic myelocytic leukemia) (HCC) Pt followed with oncology Dr. Myna Hidalgo but stopped follow up  due to life stressors Stopped taking Scemblix since August 2023  Dr. Myna Hidalgo on board, appreciate recs Scemblix is not stocked in our pharmacy, and would need to be ordered as an outpatient by oncology      Estimated body mass index is 20.18 kg/m as calculated from the following:   Height as of this encounter: 5\' 5"  (1.651 m).   Weight as of this encounter: 55 kg.     Code Status: Full  Family Communication: None at bedside  Disposition Plan: Status is: Inpatient Remains inpatient appropriate because: Level of care      Consultants: Oncology, Dr. Myna Hidalgo  Procedures: None  Antimicrobials: None  DVT prophylaxis: SCDs   Objective: Vitals:   02/14/23 0538 02/14/23 0839 02/14/23 1229 02/14/23 1717  BP: 126/75 121/64 114/64 123/81  Pulse: 89 85 86 88  Resp: 18 17 17 20   Temp: 98.5 F (36.9 C) 99.3 F (37.4 C) 99.3 F (37.4 C) 98.3 F (36.8 C)  TempSrc: Oral Oral Oral Oral  SpO2: 100% 98% 99% 100%  Weight:      Height:        Intake/Output Summary (Last 24 hours) at 02/14/2023 1727 Last data filed at 02/14/2023 1300 Gross per 24 hour  Intake 1383.05 ml  Output --  Net 1383.05 ml   Filed Weights   02/13/23 1707 02/13/23 2200  Weight: 54 kg 55 kg    Exam: General: NAD  Cardiovascular: S1, S2 present Respiratory: CTAB Abdomen: Soft, nontender, nondistended, bowel sounds present Musculoskeletal: No bilateral pedal edema noted Skin: Normal Psychiatry: Normal mood  Data Reviewed: CBC: Recent Labs  Lab 02/13/23 1726 02/14/23 0820  WBC 52.9* 33.1*  NEUTROABS 20.8* 21.8*  HGB 5.0* 7.0*  HCT 14.7* 21.0*  MCV 86.0 86.8  PLT 31* 21*   Basic Metabolic Panel: Recent Labs  Lab 02/13/23 1726 02/14/23 0821  NA 130* 134*  K 4.1 3.8  CL 99 106  CO2 19* 21*  GLUCOSE 116* 97  BUN 21* 16  CREATININE 0.77 0.54  CALCIUM 8.0* 7.5*   GFR: Estimated Creatinine Clearance: 71.4 mL/min (by C-G formula based on SCr of 0.54 mg/dL). Liver Function  Tests: Recent Labs  Lab 02/13/23 1726  AST 24  ALT 13  ALKPHOS 82  BILITOT 0.6  PROT 7.3  ALBUMIN 3.5   Recent Labs  Lab 02/13/23 1726  LIPASE 126*   No results for input(s): "AMMONIA" in the last 168 hours. Coagulation Profile: No results for input(s): "INR", "PROTIME" in the last 168 hours. Cardiac Enzymes: No results for input(s): "CKTOTAL", "CKMB", "CKMBINDEX", "TROPONINI" in the last 168 hours. BNP (last 3 results) No results for input(s): "PROBNP" in the last 8760 hours. HbA1C: No results for input(s): "HGBA1C" in the last 72 hours. CBG: No results for input(s): "GLUCAP" in the last 168 hours. Lipid Profile: No results for input(s): "CHOL", "HDL", "LDLCALC", "TRIG", "CHOLHDL", "LDLDIRECT" in the last 72 hours. Thyroid Function Tests: No results for input(s): "TSH", "T4TOTAL", "FREET4", "T3FREE", "THYROIDAB" in the last 72 hours. Anemia Panel: Recent Labs    02/14/23 0821  VITAMINB12 3,055*  TIBC 252  IRON 191*  RETICCTPCT 0.5   Urine analysis:    Component Value Date/Time   COLORURINE YELLOW 02/13/2023 1726   APPEARANCEUR CLEAR 02/13/2023 1726   LABSPEC 1.010 02/13/2023 1726   PHURINE 6.5 02/13/2023 1726   GLUCOSEU NEGATIVE 02/13/2023 1726   HGBUR TRACE (A) 02/13/2023 1726   BILIRUBINUR NEGATIVE 02/13/2023 1726   KETONESUR NEGATIVE 02/13/2023 1726   PROTEINUR NEGATIVE 02/13/2023 1726   NITRITE NEGATIVE 02/13/2023 1726   LEUKOCYTESUR NEGATIVE 02/13/2023 1726   Sepsis Labs: @LABRCNTIP (procalcitonin:4,lacticidven:4)  )No results found for this or any previous visit (from the past 240 hour(s)).    Studies: US Abdomen Complete  Result Date: 02/14/2023 CLINICAL DATA:  Chronic myeloid leukemia. EXAM: ABDOMEN ULTRASOUND COMPLETE COMPARISON:  CT examination dated June 09, 2018 FINDINGS: Gallbladder: Status post cholecystectomy Common bile duct: Diameter: 7 mm. No intrahepatic biliary ductal dilatation. Liver: No focal lesion identified. Within normal  limits in parenchymal echogenicity. Portal vein is patent on color Doppler imaging with normal direction of blood flow towards the liver. IVC: No abnormality visualized. Pancreas: Visualized portion unremarkable. Spleen: Spleen is enlarged measuring 24.0 x 10.2 x 17.4 cm. Complex cystic area in the spleen measuring up to 6.9 x 7.0 x 5.6 cm. Right Kidney: Length: 11.8. Echogenicity within normal limits. No mass or hydronephrosis visualized. Left Kidney: Length: 13.6. Echogenicity within normal limits. No mass or hydronephrosis visualized. Abdominal aorta: No aneurysm visualized. Other findings: None. IMPRESSION: 1. Enlarged spleen measuring 24.0 x 10.2 x 17.4 cm, 2218 mL. 2. Two complex cystic area in the spleen, the larger measuring up to 7.0 cm. Electronically Signed   By: Larose Hires D.O.   On: 02/14/2023 09:21   DG Chest Port 1 View  Result Date: 02/13/2023 CLINICAL DATA:  Shortness of breath EXAM: PORTABLE CHEST 1 VIEW COMPARISON:  10/12/2012 FINDINGS: Possible small left effusion. No focal airspace disease. Normal cardiac size. No pneumothorax. IMPRESSION: Possible small left effusion. No focal airspace disease. Electronically Signed  By: Jasmine Pang M.D.   On: 02/13/2023 23:09    Scheduled Meds:  sodium chloride   Intravenous Once    Continuous Infusions:   LOS: 1 day     Briant Cedar, MD [facility]  If 7PM-7AM, please contact night-coverage [email or web address] 02/14/2023, 5:27 PM

## 2023-02-15 ENCOUNTER — Inpatient Hospital Stay (HOSPITAL_COMMUNITY): Payer: Medicaid Other

## 2023-02-15 DIAGNOSIS — D649 Anemia, unspecified: Secondary | ICD-10-CM | POA: Diagnosis not present

## 2023-02-15 HISTORY — PX: IR BONE MARROW BIOPSY & ASPIRATION: IMG5727

## 2023-02-15 LAB — TYPE AND SCREEN

## 2023-02-15 LAB — CBC WITH DIFFERENTIAL/PLATELET
Abs Immature Granulocytes: 4.7 10*3/uL — ABNORMAL HIGH (ref 0.00–0.07)
Band Neutrophils: 10 %
Basophils Absolute: 0.9 10*3/uL — ABNORMAL HIGH (ref 0.0–0.1)
Basophils Relative: 3 %
Blasts: 7 %
Eosinophils Absolute: 0.3 10*3/uL (ref 0.0–0.5)
Eosinophils Relative: 1 %
HCT: 25.1 % — ABNORMAL LOW (ref 36.0–46.0)
Hemoglobin: 8.4 g/dL — ABNORMAL LOW (ref 12.0–15.0)
Lymphocytes Relative: 6 %
Lymphs Abs: 1.9 10*3/uL (ref 0.7–4.0)
MCH: 29.9 pg (ref 26.0–34.0)
MCHC: 33.5 g/dL (ref 30.0–36.0)
MCV: 89.3 fL (ref 80.0–100.0)
Metamyelocytes Relative: 5 %
Monocytes Absolute: 0.6 10*3/uL (ref 0.1–1.0)
Monocytes Relative: 2 %
Myelocytes: 10 %
Neutro Abs: 20.9 10*3/uL — ABNORMAL HIGH (ref 1.7–7.7)
Neutrophils Relative %: 56 %
Platelets: 18 10*3/uL — CL (ref 150–400)
RBC: 2.81 MIL/uL — ABNORMAL LOW (ref 3.87–5.11)
RDW: 15.8 % — ABNORMAL HIGH (ref 11.5–15.5)
WBC: 31.6 10*3/uL — ABNORMAL HIGH (ref 4.0–10.5)
nRBC: 0 % (ref 0.0–0.2)

## 2023-02-15 LAB — DIFFERENTIAL
Abs Immature Granulocytes: 12.13 10*3/uL — ABNORMAL HIGH (ref 0.00–0.07)
Basophils Absolute: 0.3 10*3/uL — ABNORMAL HIGH (ref 0.0–0.1)
Basophils Relative: 1 %
Blasts: 9 %
Eosinophils Absolute: 0.3 10*3/uL (ref 0.0–0.5)
Eosinophils Relative: 1 %
Immature Granulocytes: 23 %
Lymphocytes Relative: 17 %
Lymphs Abs: 9.2 10*3/uL — ABNORMAL HIGH (ref 0.7–4.0)
Monocytes Relative: 19 %
Myelocytes: 8 %
Neutro Abs: 20.8 10*3/uL — ABNORMAL HIGH (ref 1.7–7.7)
Promyelocytes Relative: 2 %
Smear Review: DECREASED

## 2023-02-15 LAB — BASIC METABOLIC PANEL
Anion gap: 8 (ref 5–15)
BUN: 21 mg/dL — ABNORMAL HIGH (ref 6–20)
CO2: 20 mmol/L — ABNORMAL LOW (ref 22–32)
Calcium: 7.6 mg/dL — ABNORMAL LOW (ref 8.9–10.3)
Chloride: 108 mmol/L (ref 98–111)
Creatinine, Ser: 0.56 mg/dL (ref 0.44–1.00)
GFR, Estimated: >60 mL/min (ref >60)
Glucose, Bld: 93 mg/dL (ref 70–99)
Potassium: 4.4 mmol/L (ref 3.5–5.1)
Sodium: 136 mmol/L (ref 135–145)

## 2023-02-15 LAB — BPAM RBC
ISSUE DATE / TIME: 202405220313
ISSUE DATE / TIME: 202405221706
Unit Type and Rh: 7300
Unit Type and Rh: 7300

## 2023-02-15 LAB — ECHOCARDIOGRAM COMPLETE
Height: 65 in
Weight: 1940.05 oz

## 2023-02-15 LAB — HEPATITIS B SURFACE ANTIGEN: Hepatitis B Surface Ag: NONREACTIVE

## 2023-02-15 LAB — HEPATITIS B SURFACE ANTIBODY,QUALITATIVE: Hep B S Ab: NONREACTIVE

## 2023-02-15 LAB — HEPATITIS B CORE ANTIBODY, TOTAL: Hep B Core Total Ab: NONREACTIVE

## 2023-02-15 MED ORDER — LIDOCAINE HCL 1 % IJ SOLN
10.0000 mL | Freq: Once | INTRAMUSCULAR | Status: DC
  Filled 2023-02-15: qty 10

## 2023-02-15 MED ORDER — SODIUM CHLORIDE 0.9 % IV SOLN: INTRAVENOUS | Status: DC

## 2023-02-15 MED ORDER — LIDOCAINE HCL (PF) 1 % IJ SOLN
INTRAMUSCULAR | Status: AC
  Filled 2023-02-15: qty 30

## 2023-02-15 MED ORDER — ASCIMINIB HCL 40 MG PO TABS
40.0000 mg | ORAL_TABLET | Freq: Every day | ORAL | Status: DC
  Administered 2023-02-15: 40 mg via ORAL

## 2023-02-15 MED ORDER — FENTANYL CITRATE (PF) 100 MCG/2ML IJ SOLN
INTRAMUSCULAR | Status: AC
  Filled 2023-02-15: qty 2

## 2023-02-15 MED ORDER — MIDAZOLAM HCL 2 MG/2ML IJ SOLN
INTRAMUSCULAR | Status: AC
  Filled 2023-02-15: qty 2

## 2023-02-15 MED ORDER — MIDAZOLAM HCL 2 MG/2ML IJ SOLN
INTRAMUSCULAR | Status: AC | PRN
  Administered 2023-02-15: 1 mg via INTRAVENOUS

## 2023-02-15 MED ORDER — FENTANYL CITRATE (PF) 100 MCG/2ML IJ SOLN
INTRAMUSCULAR | Status: AC | PRN
  Administered 2023-02-15: 50 ug via INTRAVENOUS

## 2023-02-15 NOTE — Sedation Documentation (Signed)
Sample #1 obtained 

## 2023-02-15 NOTE — TOC Initial Note (Signed)
Transition of Care [facility]) - Initial/Assessment Note    Patient Details  Name: Victoria [name] MRN: [id number] Date of Birth: [birth year]  Transition of Care [facility]) CM/SW Contact:    Howell Rucks, RN Phone Number: 02/15/2023, 11:09 AM  Clinical Narrative: [facility] referral received for Medication Assistance. Pt undergoing procedure today. Will continue to follow.                         Patient Goals and CMS Choice            Expected Discharge Plan and Services                                              Prior Living Arrangements/Services                       Activities of Daily Living Home Assistive Devices/Equipment: Eyeglasses ADL Screening (condition at time of admission) Patient's cognitive ability adequate to safely complete daily activities?: Yes Is the patient deaf or have difficulty hearing?: No Does the patient have difficulty seeing, even when wearing glasses/contacts?: No Does the patient have difficulty concentrating, remembering, or making decisions?: No Patient able to express need for assistance with ADLs?: Yes Does the patient have difficulty dressing or bathing?: No Independently performs ADLs?: Yes (appropriate for developmental age) Does the patient have difficulty walking or climbing stairs?: No Weakness of Legs: None Weakness of Arms/Hands: None  Permission Sought/Granted                  Emotional Assessment              Admission diagnosis:  Symptomatic anemia [D64.9] Acute anemia [D64.9] Patient Active Problem List   Diagnosis Date Noted   Acute anemia 02/13/2023   Thrombocytopenia (HCC) 02/13/2023   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   PCP:  Patient, No Pcp Per Pharmacy:   [facility] [place], [place] - [address] [facility] [address] [place] [address] Phone: [phone] Fax: [phone]  [facility]) - [place], [place]  - [address] [place] [address] Phone: [phone] Fax: [phone]  [facility] - [place], [place] - [address] [place] [address] Phone: [phone] Fax: [phone]     Social Determinants of Health (SDOH) Social History: SDOH Screenings   Food Insecurity: No Food Insecurity (02/13/2023)  Housing: Low Risk  (02/13/2023)  Transportation Needs: No Transportation Needs (02/13/2023)  Utilities: Not At Risk (02/13/2023)  Tobacco Use: Low Risk  (02/13/2023)   SDOH Interventions:     Readmission Risk Interventions     No data to display

## 2023-02-15 NOTE — Progress Notes (Signed)
PROGRESS NOTE  ALYVEA KICK ZOX:[id number] DOB: 01-May-1971 DOA: 02/13/2023 PCP: Patient, No Pcp Per  HPI/Recap of past 24 hours: Victoria [name] is a 52 y.o. female with medical history significant of CML who presents with dizziness, worsening shortness of breath worse with exertion, fatigue for a couple of weeks. Reports left sided abdominal side pain radiating to left shoulder. Also has new cough. No fever. Has been through a lot of stressors this past couple of weeks, her friend's son passed away unexpectedly, as well as a recent divorce.  Patient does have a history of CML and has been following Dr. Myna Hidalgo but was lost to follow-up and stopped taking her Scemblix since August 2023.  Patient denies any bleeding or dark stools and no longer has her menstrual cycle. In the ED, noted low grade temperature up to 100.74F, tachycardic and required 2L via [place]. WBC of 52K with moderate left shift and absolute lymphocytosis. Hgb of 5 down from baseline around 12 a year ago. Plt of 31. CXR ordered but pt refused.  Oncology Dr. Myna Hidalgo consulted.  Patient admitted for further management.      Today, saw patient after bone marrow biopsy, denies any new complaints except for poor oral intake, requesting some IV hydration. Refused ECHO, explained the need for it. Wants to go home tomorrow as her daughters birthday is tomorrow    Assessment/Plan: Principal Problem:   Acute anemia Active Problems:   CML (chronic myelocytic leukemia) (HCC)   Thrombocytopenia (HCC)  Acute anemia possibly 2/2 untreated CML Presents with Hgb of 5 with remote baseline of 12 Denies any bleeding, melena, no menstrual cycles Transfused 3 units pRBC with hgb now at 8.4 Daily CBC  Thrombocytopenia (HCC) Noted splenomegaly Platelet downtrending Abdominal ultrasound showed enlarged spleen measuring 24 x 10 x 17 Continue to monitor, transfuse platelets if bleeding or less than 10,000 Daily CBC   CML (chronic  myelocytic leukemia) (HCC) Pt followed with oncology Dr. Myna Hidalgo but stopped follow up due to life stressors Stopped taking Scemblix since August 2023  Dr. Myna Hidalgo on board, appreciate recs S/p bone marrow biopsy on 5/23, result pending Echo pending Scemblix is not stocked in our pharmacy, and pt would need to bring hers from home     Estimated body mass index is 20.18 kg/m as calculated from the following:   Height as of this encounter: 5\' 5"  (1.651 m).   Weight as of this encounter: 55 kg.     Code Status: Full  Family Communication: None at bedside  Disposition Plan: Status is: Inpatient Remains inpatient appropriate because: Level of care      Consultants: Oncology, Dr. Myna Hidalgo  Procedures: Bone marrow biopsy on 5/23  Antimicrobials: None  DVT prophylaxis: SCDs   Objective: Vitals:   02/15/23 1040 02/15/23 1043 02/15/23 1101 02/15/23 1325  BP: 114/74 118/69 116/73 112/65  Pulse: 80 79 83 86  Resp: 17 20 18 18   Temp:   98.5 F (36.9 C) 98.9 F (37.2 C)  TempSrc:   Oral Oral  SpO2: 98% 99% 98% 100%  Weight:      Height:        Intake/Output Summary (Last 24 hours) at 02/15/2023 1728 Last data filed at 02/15/2023 1300 Gross per 24 hour  Intake 895 ml  Output --  Net 895 ml   Filed Weights   02/13/23 1707 02/13/23 2200  Weight: 54 kg 55 kg    Exam: General: NAD  Cardiovascular: S1, S2 present Respiratory: CTAB Abdomen:  Soft, nontender, nondistended, bowel sounds present Musculoskeletal: No bilateral pedal edema noted Skin: Normal Psychiatry: Normal mood    Data Reviewed: CBC: Recent Labs  Lab 02/13/23 1726 02/14/23 0820 02/14/23 2119 02/15/23 0505  WBC 52.9* 33.1* 31.9* 31.6*  NEUTROABS 20.8* 21.8*  --  20.9*  HGB 5.0* 7.0* 7.9* 8.4*  HCT 14.7* 21.0* 24.0* 25.1*  MCV 86.0 86.8 87.9 89.3  PLT 31* 21* 18* 18*   Basic Metabolic Panel: Recent Labs  Lab 02/13/23 1726 02/14/23 0821 02/15/23 0505  NA 130* 134* 136  K 4.1 3.8  4.4  CL 99 106 108  CO2 19* 21* 20*  GLUCOSE 116* 97 93  BUN 21* 16 21*  CREATININE 0.77 0.54 0.56  CALCIUM 8.0* 7.5* 7.6*   GFR: Estimated Creatinine Clearance: 71.4 mL/min (by C-G formula based on SCr of 0.56 mg/dL). Liver Function Tests: Recent Labs  Lab 02/13/23 1726  AST 24  ALT 13  ALKPHOS 82  BILITOT 0.6  PROT 7.3  ALBUMIN 3.5   Recent Labs  Lab 02/13/23 1726  LIPASE 126*   No results for input(s): "AMMONIA" in the last 168 hours. Coagulation Profile: No results for input(s): "INR", "PROTIME" in the last 168 hours. Cardiac Enzymes: No results for input(s): "CKTOTAL", "CKMB", "CKMBINDEX", "TROPONINI" in the last 168 hours. BNP (last 3 results) No results for input(s): "PROBNP" in the last 8760 hours. HbA1C: No results for input(s): "HGBA1C" in the last 72 hours. CBG: No results for input(s): "GLUCAP" in the last 168 hours. Lipid Profile: No results for input(s): "CHOL", "HDL", "LDLCALC", "TRIG", "CHOLHDL", "LDLDIRECT" in the last 72 hours. Thyroid Function Tests: No results for input(s): "TSH", "T4TOTAL", "FREET4", "T3FREE", "THYROIDAB" in the last 72 hours. Anemia Panel: Recent Labs    02/14/23 0821  VITAMINB12 3,055*  TIBC 252  IRON 191*  RETICCTPCT 0.5   Urine analysis:    Component Value Date/Time   COLORURINE YELLOW 02/13/2023 1726   APPEARANCEUR CLEAR 02/13/2023 1726   LABSPEC 1.010 02/13/2023 1726   PHURINE 6.5 02/13/2023 1726   GLUCOSEU NEGATIVE 02/13/2023 1726   HGBUR TRACE (A) 02/13/2023 1726   BILIRUBINUR NEGATIVE 02/13/2023 1726   KETONESUR NEGATIVE 02/13/2023 1726   PROTEINUR NEGATIVE 02/13/2023 1726   NITRITE NEGATIVE 02/13/2023 1726   LEUKOCYTESUR NEGATIVE 02/13/2023 1726   Sepsis Labs: @LABRCNTIP (procalcitonin:4,lacticidven:4)  )No results found for this or any previous visit (from the past 240 hour(s)).    Studies: IR BONE MARROW BIOPSY & ASPIRATION  Result Date: 02/15/2023 INDICATION: 52 year old history of CML. EXAM:  FLUOROSCOPIC-GUIDED BONE MARROW BIOPSY AND ASPIRATION MEDICATIONS: None ANESTHESIA/SEDATION: Fentanyl 100 mcg IV; Versed 2 mg IV Sedation Time: 10 minutes; The patient was continuously monitored during the procedure by the interventional radiology nurse under my direct supervision. COMPLICATIONS: None immediate. PROCEDURE: Informed consent was obtained from the patient following an explanation of the procedure, risks, benefits and alternatives. The patient understands, agrees and consents for the procedure. All questions were addressed. A time out was performed prior to the initiation of the procedure. The patient was positioned prone and the right iliac marrow space was identified fluoroscopically. The operative site was prepped and draped in the usual sterile fashion. Under sterile conditions and local anesthesia, a 22 gauge spinal needle was utilized for procedural planning. Next, an 11 gauge coaxial bone biopsy needle was advanced into the right iliac marrow space. Initially, a bone marrow aspiration was performed. Next, a bone marrow biopsy was obtained with the 11 gauge outer bone marrow device. Samples were prepared  with the cytotechnologist and deemed adequate. The needle was removed and superficial hemostasis was obtained with manual compression. A dressing was applied. The patient tolerated the procedure well without immediate post procedural complication. IMPRESSION: Successful fluoroscopic guided right iliac bone marrow aspiration and core biopsy. Marliss Coots, MD [facility] Electronically Signed   By: Marliss Coots M.D.   On: 02/15/2023 12:36    Scheduled Meds:  sodium chloride   Intravenous Once   asciminib hcl  40 mg Oral Daily   lidocaine  10 mL Intradermal Once    Continuous Infusions:  sodium chloride       LOS: 2 days     Briant Cedar, MD [facility]  If 7PM-7AM, please contact  night-coverage [email or web address] 02/15/2023, 5:28 PM

## 2023-02-15 NOTE — Progress Notes (Signed)
Victoria [name] may feel a little bit better.  She is tired.  She is not taking her Scemblix probably for 8 months.  When I look at her blood smear of the microscope, I worry that she has progressed to at least accelerated phase.  I worry that she she might be in blast phase.  As such, we will going to have to get a bone marrow biopsy on her.  I put the order in for a bone marrow biopsy to try to be done today.  I told her not to eat anything.  Her spleen is quite enlarged.  I think it is 2200 cm.  I am sure this is why her platelet count is dropping.  Today, her white cell count is 31.9.  Hemoglobin 7.9.  Platelet count 18,000.  I did anemia studies on her.  Her B12 is quite high at 3000.  Her iron saturation is high at 76%.  I do worry that her bone marrow might be "packed" with leukemia cells that are causing some of the anemia and her thrombocytopenia.  I talked to her about the bone marrow test.  She is agreeable to have this done.  Again, hopefully can be done today.  She has relatively normal electrolytes.   Her vital signs are all stable.  Temperature 100 degrees.  Pulse 86.  Blood pressure 126/74.  Her exam really is unchanged from yesterday.  She still has a marked splenomegaly.  Her lungs sound clear bilaterally.  She has good air movement bilaterally.  Cardiac exam regular rate and rhythm.  Extremities shows some scattered ecchymoses.  Neurological exam is nonfocal.   Victoria [name] clearly has CML that it looks like is progressing.  We really have to get her back onto her Scemblix.  She says that she has Scemblix at home.  Hopefully she will be able to get this in so she can start taking this today.  She is on 40 mg a day.  I think we will have to see how her labs look in the a.m.  I will try to hold off on transfusing her with platelets.  I do appreciate the great care she is getting from everybody of on 4 E.  Christin Bach, MD  Duwayne Heck 41:10

## 2023-02-15 NOTE — Procedures (Signed)
Interventional Radiology Procedure Note  Procedure: Fluoroscopic guided aspirate and core biopsy of right iliac bone  Complications: None  Recommendations: - Bedrest supine x 1 hrs - Follow biopsy results   Marliss Coots, MD

## 2023-02-15 NOTE — Progress Notes (Signed)
   Attempted echo @ 2:33. Pt asked for the reason of the test. I explained that cardiology wanted to evaluate the structure and function of your heart. Pt wanted to speak with a doctor first. Told pt that we will put the order on hold and re-evaluate tomorrow.

## 2023-02-15 NOTE — Consult Note (Signed)
Chief Complaint: Patient was seen in consultation today for image guided bone marrow biopsy Chief Complaint  Patient presents with   Shortness of Breath   Dizziness    Referring Physician(s): Ennever,P  Supervising Physician: Marliss Coots  Patient Status: [facility] - In-pt  History of Present Illness: Victoria [name] is a 52 y.o. female with PMH sig for anemia, anxiety/depression and CML  previously on Scemblix. She was admitted to [facility] on 5/21 with dizziness, dyspnea, worsening fatigue, LUQ abd pain, thrombocytopenia, hgb 5- has been transfused. [place] abd revealed splenomegaly with 2 complex cystic areas, CXR with small left effusion. Current labs include WBC 31.6, hgb 8.4(7.9), plts 18k, creat 0.56. Due to concern for progression of her CML request now received from oncology for image guided bone marrow biopsy for further evaluation.   Past Medical History:  Diagnosis Date   Anemia    HX   Anxiety    no meds   Chronic myelogenous leukemia (HCC) 2010   Depression    no meds   PONV (postoperative nausea and vomiting)     Past Surgical History:  Procedure Laterality Date   BREAST SURGERY     reduction   CHOLECYSTECTOMY     DILATATION & CURRETTAGE/HYSTEROSCOPY WITH RESECTOCOPE N/A 10/27/2013   Procedure: DILATATION & CURETTAGE/HYSTEROSCOPY WITH RESECTOCOPE;  Surgeon: Genia Del, MD;  Location: [facility];  Service: Gynecology;  Laterality: N/A;   DILATION AND CURETTAGE OF UTERUS     TAB    Allergies: Patient has no known allergies.  Medications: Prior to Admission medications   Medication Sig Start Date End Date Taking? Authorizing Provider  Asciminib HCl (SCEMBLIX) 40 MG TABS Take two tablets (80 mg) by mouth daily. Take on an empty stomach at least 2 hours before or 1 hour after food. Patient not taking: Reported on 02/14/2023 03/02/21   Josph Macho, MD     History reviewed. No pertinent family history.  Social History   Socioeconomic History   Marital  status: Married    Spouse name: Not on file   Number of children: Not on file   Years of education: Not on file   Highest education level: Not on file  Occupational History   Not on file  Tobacco Use   Smoking status: Never   Smokeless tobacco: Never  Vaping Use   Vaping Use: Never used  Substance and Sexual Activity   Alcohol use: Yes    Alcohol/week: 0.0 standard drinks of alcohol    Comment: social   Drug use: No   Sexual activity: Yes    Birth control/protection: None  Other Topics Concern   Not on file  Social History Narrative   Not on file   Social Determinants of Health   Financial Resource Strain: Not on file  Food Insecurity: No Food Insecurity (02/13/2023)   Hunger Vital Sign    Worried About Running Out of Food in the Last Year: Never true    Ran Out of Food in the Last Year: Never true  Transportation Needs: No Transportation Needs (02/13/2023)   PRAPARE - Administrator, Civil Service (Medical): No    Lack of Transportation (Non-Medical): No  Physical Activity: Not on file  Stress: Not on file  Social Connections: Not on file      Review of Systems see above; denies HA, CP, back pain,N/V  Vital Signs: BP 126/74 (BP Location: Left Arm)   Pulse 86   Temp 100 F (37.8 C) (Oral)  Resp 18   Ht 5\' 5"  (1.651 m)   Wt 121 lb 4.1 oz (55 kg)   LMP  (Exact Date)   SpO2 99%   BMI 20.18 kg/m     Physical Exam: awake/alert; chest- sl dim BS left base, right clear; heart- RRR; abd- soft,+BS, marked splenomegaly, tender LUQ to palpation; no LE edema  Imaging: US Abdomen Complete  Result Date: 02/14/2023 CLINICAL DATA:  Chronic myeloid leukemia. EXAM: ABDOMEN ULTRASOUND COMPLETE COMPARISON:  CT examination dated June 09, 2018 FINDINGS: Gallbladder: Status post cholecystectomy Common bile duct: Diameter: 7 mm. No intrahepatic biliary ductal dilatation. Liver: No focal lesion identified. Within normal limits in parenchymal echogenicity. Portal  vein is patent on color Doppler imaging with normal direction of blood flow towards the liver. IVC: No abnormality visualized. Pancreas: Visualized portion unremarkable. Spleen: Spleen is enlarged measuring 24.0 x 10.2 x 17.4 cm. Complex cystic area in the spleen measuring up to 6.9 x 7.0 x 5.6 cm. Right Kidney: Length: 11.8. Echogenicity within normal limits. No mass or hydronephrosis visualized. Left Kidney: Length: 13.6. Echogenicity within normal limits. No mass or hydronephrosis visualized. Abdominal aorta: No aneurysm visualized. Other findings: None. IMPRESSION: 1. Enlarged spleen measuring 24.0 x 10.2 x 17.4 cm, 2218 mL. 2. Two complex cystic area in the spleen, the larger measuring up to 7.0 cm. Electronically Signed   By: Larose Hires D.O.   On: 02/14/2023 09:21   DG Chest Port 1 View  Result Date: 02/13/2023 CLINICAL DATA:  Shortness of breath EXAM: PORTABLE CHEST 1 VIEW COMPARISON:  10/12/2012 FINDINGS: Possible small left effusion. No focal airspace disease. Normal cardiac size. No pneumothorax. IMPRESSION: Possible small left effusion. No focal airspace disease. Electronically Signed   By: Jasmine Pang M.D.   On: 02/13/2023 23:09    Labs:  CBC: Recent Labs    02/13/23 1726 02/14/23 0820 02/14/23 2119 02/15/23 0505  WBC 52.9* 33.1* 31.9* 31.6*  HGB 5.0* 7.0* 7.9* 8.4*  HCT 14.7* 21.0* 24.0* 25.1*  PLT 31* 21* 18* 18*    COAGS: No results for input(s): "INR", "APTT" in the last 8760 hours.  BMP: Recent Labs    02/13/23 1726 02/14/23 0821 02/15/23 0505  NA 130* 134* 136  K 4.1 3.8 4.4  CL 99 106 108  CO2 19* 21* 20*  GLUCOSE 116* 97 93  BUN 21* 16 21*  CALCIUM 8.0* 7.5* 7.6*  CREATININE 0.77 0.54 0.56  GFRNONAA >60 >60 >60    LIVER FUNCTION TESTS: Recent Labs    02/13/23 1726  BILITOT 0.6  AST 24  ALT 13  ALKPHOS 82  PROT 7.3  ALBUMIN 3.5    TUMOR MARKERS: No results for input(s): "AFPTM", "CEA", "CA199", "CHROMGRNA" in the last 8760  hours.  Assessment and Plan: 52 y.o. female with PMH sig for anemia, anxiety/depression and CML  previously on Scemblix. She was admitted to [facility] on 5/21 with dizziness, dyspnea, worsening fatigue, LUQ abd pain, thrombocytopenia, hgb 5- has been transfused. [place] abd revealed splenomegaly with 2 complex cystic areas, CXR with small left effusion. Current labs include WBC 31.6, hgb 8.4(7.9), plts 18k, creat 0.56. Due to concern for progression of her CML request now received from oncology for image guided bone marrow biopsy for further evaluation.Risks and benefits of procedure was discussed with the patient  including, but not limited to bleeding, infection, damage to adjacent structures or low yield requiring additional tests.  All of the questions were answered and there is agreement to proceed.  Consent  signed and in chart. Procedure scheduled for this am.    Thank you for this interesting consult.  I greatly enjoyed meeting Victoria [name] and look forward to participating in their care.  A copy of this report was sent to the requesting provider on this date.  Electronically Signed: D. Jeananne Rama, PA-C 02/15/2023, 9:16 AM   I spent a total of   20 minutes  in face to face in clinical consultation, greater than 50% of which was counseling/coordinating care for image guided bone marrow biopsy

## 2023-02-15 NOTE — Plan of Care (Signed)

## 2023-02-16 ENCOUNTER — Inpatient Hospital Stay (HOSPITAL_COMMUNITY): Payer: Medicaid Other

## 2023-02-16 DIAGNOSIS — R000 Tachycardia, unspecified: Secondary | ICD-10-CM | POA: Diagnosis not present

## 2023-02-16 DIAGNOSIS — D649 Anemia, unspecified: Secondary | ICD-10-CM | POA: Diagnosis not present

## 2023-02-16 DIAGNOSIS — R0602 Shortness of breath: Secondary | ICD-10-CM | POA: Diagnosis not present

## 2023-02-16 LAB — CBC WITH DIFFERENTIAL/PLATELET
Basophils Absolute: 0.5 10*3/uL — ABNORMAL HIGH (ref 0.0–0.1)
Basophils Relative: 2 %
Eosinophils Absolute: 0.1 10*3/uL (ref 0.0–0.5)
Eosinophils Relative: 1 %
HCT: 24.2 % — ABNORMAL LOW (ref 36.0–46.0)
Immature Granulocytes: 26 %
MCV: 90.6 fL (ref 80.0–100.0)
Neutro Abs: 9.8 10*3/uL — ABNORMAL HIGH (ref 1.7–7.7)
RBC: 2.67 MIL/uL — ABNORMAL LOW (ref 3.87–5.11)

## 2023-02-16 LAB — BPAM RBC
Blood Product Expiration Date: 202406182359
Blood Product Expiration Date: 202406182359
Blood Product Expiration Date: 202406202359
Unit Type and Rh: 1700

## 2023-02-16 LAB — TYPE AND SCREEN
ABO/RH(D): B POS
Antibody Screen: NEGATIVE
Unit division: 0

## 2023-02-16 LAB — ECHOCARDIOGRAM COMPLETE
AR max vel: 2.11 cm2
AV Area VTI: 2.06 cm2
AV Area mean vel: 2.11 cm2
AV Mean grad: 5 mmHg
AV Peak grad: 9.1 mmHg
Ao pk vel: 1.51 m/s
Area-P 1/2: 2.78 cm2
Calc EF: 57.2 %
MV VTI: 3.04 cm2
S' Lateral: 3.1 cm
Single Plane A2C EF: 56.1 %
Single Plane A4C EF: 55.3 %

## 2023-02-16 LAB — BASIC METABOLIC PANEL
Anion gap: 6 (ref 5–15)
BUN: 19 mg/dL (ref 6–20)
CO2: 21 mmol/L — ABNORMAL LOW (ref 22–32)
Calcium: 7.4 mg/dL — ABNORMAL LOW (ref 8.9–10.3)
Chloride: 107 mmol/L (ref 98–111)
Creatinine, Ser: 0.57 mg/dL (ref 0.44–1.00)
GFR, Estimated: >60 mL/min (ref >60)
Glucose, Bld: 94 mg/dL (ref 70–99)
Potassium: 4 mmol/L (ref 3.5–5.1)
Sodium: 134 mmol/L — ABNORMAL LOW (ref 135–145)

## 2023-02-16 LAB — PREPARE PLATELET PHERESIS: Unit division: 0

## 2023-02-16 LAB — PREPARE RBC (CROSSMATCH)

## 2023-02-16 LAB — HEMOGLOBIN AND HEMATOCRIT, BLOOD
HCT: 27.7 % — ABNORMAL LOW (ref 36.0–46.0)
Hemoglobin: 9.3 g/dL — ABNORMAL LOW (ref 12.0–15.0)

## 2023-02-16 LAB — BPAM PLATELET PHERESIS
Blood Product Expiration Date: 202405282359
Unit Type and Rh: 9500

## 2023-02-16 MED ORDER — ASCIMINIB HCL 40 MG PO TABS: 80.0000 mg | ORAL_TABLET | Freq: Every day | ORAL | Status: DC

## 2023-02-16 MED ORDER — FUROSEMIDE 10 MG/ML IJ SOLN
20.0000 mg | Freq: Once | INTRAMUSCULAR | Status: AC
  Administered 2023-02-16: 20 mg via INTRAVENOUS
  Filled 2023-02-16: qty 2

## 2023-02-16 MED ORDER — SODIUM CHLORIDE 0.9% IV SOLUTION: Freq: Once | INTRAVENOUS | Status: AC

## 2023-02-16 NOTE — Discharge Summary (Signed)
Physician Discharge Summary   Patient: KEYANI MALLET MRN: [id number] DOB: 1971-06-02  Admit date:     02/13/2023  Discharge date: 02/16/2023  Discharge Physician: Briant Cedar   PCP: Patient, No Pcp Per   Recommendations at discharge:   Follow-up with oncology Dr. Myna Hidalgo who is her primary care  Discharge Diagnoses: Principal Problem:   Acute anemia Active Problems:   CML (chronic myelocytic leukemia) (HCC)   Thrombocytopenia [facility])    Hospital Course: HENLEY REINECK is a 52 y.o. female with medical history significant of CML who presents with dizziness, worsening shortness of breath worse with exertion, fatigue for a couple of weeks. Reports left sided abdominal side pain radiating to left shoulder. Also has new cough. No fever. Has been through a lot of stressors this past couple of weeks, her friend's son passed away unexpectedly, as well as a recent divorce.  Patient does have a history of CML and has been following Dr. Myna Hidalgo but was lost to follow-up and stopped taking her Scemblix since August 2023.  Patient denies any bleeding or dark stools and no longer has her menstrual cycle. In the ED, noted low grade temperature up to 100.93F, tachycardic and required 2L via [place]. WBC of 52K with moderate left shift and absolute lymphocytosis. Hgb of 5 down from baseline around 12 a year ago. Plt of 31.  Oncology Dr. Myna Hidalgo consulted.  Patient admitted for further management.     Today, patient denies any new complaints.  Very eager to go home.  Received another dose of packed RBC and 1 unit of platelets.  Advised to follow closely with oncology Dr. Myna Hidalgo, patient verbalized understanding.     Assessment and Plan:  Acute anemia possibly 2/2 untreated CML Presents with Hgb of 5 with remote baseline of 12 Denies any bleeding, melena, no menstrual cycles Transfused 4 units pRBC   Thrombocytopenia (HCC) Noted splenomegaly Platelet downtrending Abdominal ultrasound  showed enlarged spleen measuring 24 x 10 x 17 Transfused 1U of  platelets on 5/24   CML (chronic myelocytic leukemia) (HCC) Noted leukocytosis Pt followed with oncology Dr. Myna Hidalgo but stopped follow up due to life stressors Stopped taking Scemblix since August 2023, restarted on 5/23 during this admission Dr. Myna Hidalgo on board, appreciate recs S/p bone marrow biopsy on 5/23, result pending Echo with normal EF, grade 1 DD Outpatient follow-up with oncology     Consultants: Oncology Procedures performed: Bone marrow biopsy Disposition: Home  Diet recommendation:  Discharge Diet Orders (From admission, onward)     Start     Ordered   02/16/23 0000  Diet - low sodium heart healthy        02/16/23 1544           Regular diet   DISCHARGE MEDICATION: Allergies as of 02/16/2023   No Known Allergies      Medication List     TAKE these medications    Scemblix 40 MG tablet Generic drug: asciminib hcl Take two tablets (80 mg) by mouth daily. Take on an empty stomach at least 2 hours before or 1 hour after food.        Discharge Exam: Filed Weights   02/13/23 1707 02/13/23 2200  Weight: 54 kg 55 kg   General: NAD  Cardiovascular: S1, S2 present Respiratory: CTAB Abdomen: Soft, nontender, nondistended, bowel sounds present Musculoskeletal: No bilateral pedal edema noted Skin: Normal Psychiatry: Normal mood   Condition at discharge: stable  The results of significant diagnostics from this  hospitalization (including imaging, microbiology, ancillary and laboratory) are listed below for reference.   Imaging Studies: ECHOCARDIOGRAM COMPLETE  Result Date: 02/16/2023    ECHOCARDIOGRAM REPORT   Patient Name:   ZINDA VAZIRI Date of Exam: 02/16/2023 Medical Rec #:  [id number]          Height:       65.0 in Accession #:    [id number]         Weight:       121.3 lb Date of Birth:  Oct 23, 1970           BSA:          1.599 m Patient Age:    51 years           BP:            109/79 mmHg Patient Gender: F                  HR:           86 bpm. Exam Location:  Inpatient Procedure: 2D Echo, Cardiac Doppler and Color Doppler Indications:    tachycardia  History:        Patient has no prior history of Echocardiogram examinations.  Sonographer:    NA Referring Phys: [id number] PETER R ENNEVER IMPRESSIONS  1. Left ventricular ejection fraction, by estimation, is 55 to 60%. The left ventricle has normal function. The left ventricle has no regional wall motion abnormalities. Left ventricular diastolic parameters are consistent with Grade I diastolic dysfunction (impaired relaxation). The average left ventricular global longitudinal strain is -17.3 %. The global longitudinal strain is abnormal.  2. Right ventricular systolic function is normal. The right ventricular size is normal. There is normal pulmonary artery systolic pressure. The estimated right ventricular systolic pressure is 22.5 mmHg.  3. The mitral valve is normal in structure. No evidence of mitral valve regurgitation. No evidence of mitral stenosis.  4. The aortic valve is tricuspid. Aortic valve regurgitation is not visualized. No aortic stenosis is present.  5. The inferior vena cava is normal in size with greater than 50% respiratory variability, suggesting right atrial pressure of 3 mmHg.  6. A small pericardial effusion is present. The pericardial effusion is localized near the right ventricle. There is no evidence of cardiac tamponade. FINDINGS  Left Ventricle: Left ventricular ejection fraction, by estimation, is 55 to 60%. The left ventricle has normal function. The left ventricle has no regional wall motion abnormalities. The average left ventricular global longitudinal strain is -17.3 %. The global longitudinal strain is abnormal. The left ventricular internal cavity size was normal in size. There is no left ventricular hypertrophy. Left ventricular diastolic parameters are consistent with Grade I diastolic dysfunction  (impaired relaxation). Right Ventricle: The right ventricular size is normal. No increase in right ventricular wall thickness. Right ventricular systolic function is normal. There is normal pulmonary artery systolic pressure. The tricuspid regurgitant velocity is 2.21 m/s, and  with an assumed right atrial pressure of 3 mmHg, the estimated right ventricular systolic pressure is 22.5 mmHg. Left Atrium: Left atrial size was normal in size. Right Atrium: Right atrial size was normal in size. Pericardium: A small pericardial effusion is present. The pericardial effusion is localized near the right ventricle. There is no evidence of cardiac tamponade. Mitral Valve: The mitral valve is normal in structure. No evidence of mitral valve regurgitation. No evidence of mitral valve stenosis. MV peak gradient, 1.7 mmHg. The mean mitral valve gradient is 1.0 mmHg.  Tricuspid Valve: The tricuspid valve is normal in structure. Tricuspid valve regurgitation is trivial. Aortic Valve: The aortic valve is tricuspid. Aortic valve regurgitation is not visualized. No aortic stenosis is present. Aortic valve mean gradient measures 5.0 mmHg. Aortic valve peak gradient measures 9.1 mmHg. Aortic valve area, by VTI measures 2.06 cm. Pulmonic Valve: The pulmonic valve was normal in structure. Pulmonic valve regurgitation is not visualized. Aorta: The aortic root is normal in size and structure. Venous: The inferior vena cava is normal in size with greater than 50% respiratory variability, suggesting right atrial pressure of 3 mmHg. IAS/Shunts: No atrial level shunt detected by color flow Doppler.  LEFT VENTRICLE PLAX 2D LVIDd:         4.20 cm     Diastology LVIDs:         3.10 cm     LV e' medial:    7.27 cm/s LV PW:         1.00 cm     LV E/e' medial:  8.7 LV IVS:        0.90 cm     LV e' lateral:   8.51 cm/s LVOT diam:     1.80 cm     LV E/e' lateral: 7.4 LV SV:         55 LV SV Index:   35          2D Longitudinal Strain LVOT Area:     2.54  cm    2D Strain GLS Avg:     -17.3 %  LV Volumes (MOD) LV vol d, MOD A2C: 72.0 ml LV vol d, MOD A4C: 68.2 ml LV vol s, MOD A2C: 31.6 ml LV vol s, MOD A4C: 30.5 ml LV SV MOD A2C:     40.4 ml LV SV MOD A4C:     68.2 ml LV SV MOD BP:      41.7 ml RIGHT VENTRICLE             IVC RV Basal diam:  3.60 cm     IVC diam: 1.50 cm RV S prime:     12.60 cm/s TAPSE (M-mode): 2.1 cm LEFT ATRIUM             Index        RIGHT ATRIUM           Index LA diam:        2.60 cm 1.63 cm/m   RA Area:     14.00 cm LA Vol (A2C):   25.1 ml 15.70 ml/m  RA Volume:   33.20 ml  20.76 ml/m LA Vol (A4C):   29.6 ml 18.51 ml/m LA Biplane Vol: 27.8 ml 17.38 ml/m  AORTIC VALVE AV Area (Vmax):    2.11 cm AV Area (Vmean):   2.11 cm AV Area (VTI):     2.06 cm AV Vmax:           150.50 cm/s AV Vmean:          105.500 cm/s AV VTI:            0.268 m AV Peak Grad:      9.1 mmHg AV Mean Grad:      5.0 mmHg LVOT Vmax:         125.00 cm/s LVOT Vmean:        87.400 cm/s LVOT VTI:          0.218 m LVOT/AV VTI ratio: 0.81  AORTA Ao Root diam: 3.00 cm Ao  Asc diam:  2.90 cm MITRAL VALVE               TRICUSPID VALVE MV Area (PHT): 2.78 cm    TR Peak grad:   19.5 mmHg MV Area VTI:   3.04 cm    TR Vmax:        221.00 cm/s MV Peak grad:  1.7 mmHg MV Mean grad:  1.0 mmHg    SHUNTS MV Vmax:       0.65 m/s    Systemic VTI:  0.22 m MV Vmean:      48.3 cm/s   Systemic Diam: 1.80 cm MV Decel Time: 273 msec MV E velocity: 63.10 cm/s MV A velocity: 62.50 cm/s MV E/A ratio:  1.01 Dalton McleanMD Electronically signed by Wilfred Lacy Signature Date/Time: 02/16/2023/2:14:48 PM    Final    IR BONE MARROW BIOPSY & ASPIRATION  Result Date: 02/15/2023 INDICATION: 52 year old history of CML. EXAM: FLUOROSCOPIC-GUIDED BONE MARROW BIOPSY AND ASPIRATION MEDICATIONS: None ANESTHESIA/SEDATION: Fentanyl 100 mcg IV; Versed 2 mg IV Sedation Time: 10 minutes; The patient was continuously monitored during the procedure by the interventional radiology nurse under my direct  supervision. COMPLICATIONS: None immediate. PROCEDURE: Informed consent was obtained from the patient following an explanation of the procedure, risks, benefits and alternatives. The patient understands, agrees and consents for the procedure. All questions were addressed. A time out was performed prior to the initiation of the procedure. The patient was positioned prone and the right iliac marrow space was identified fluoroscopically. The operative site was prepped and draped in the usual sterile fashion. Under sterile conditions and local anesthesia, a 22 gauge spinal needle was utilized for procedural planning. Next, an 11 gauge coaxial bone biopsy needle was advanced into the right iliac marrow space. Initially, a bone marrow aspiration was performed. Next, a bone marrow biopsy was obtained with the 11 gauge outer bone marrow device. Samples were prepared with the cytotechnologist and deemed adequate. The needle was removed and superficial hemostasis was obtained with manual compression. A dressing was applied. The patient tolerated the procedure well without immediate post procedural complication. IMPRESSION: Successful fluoroscopic guided right iliac bone marrow aspiration and core biopsy. Marliss Coots, MD [facility] Radiology Electronically Signed   By: Marliss Coots M.D.   On: 02/15/2023 12:36   US Abdomen Complete  Result Date: 02/14/2023 CLINICAL DATA:  Chronic myeloid leukemia. EXAM: ABDOMEN ULTRASOUND COMPLETE COMPARISON:  CT examination dated June 09, 2018 FINDINGS: Gallbladder: Status post cholecystectomy Common bile duct: Diameter: 7 mm. No intrahepatic biliary ductal dilatation. Liver: No focal lesion identified. Within normal limits in parenchymal echogenicity. Portal vein is patent on color Doppler imaging with normal direction of blood flow towards the liver. IVC: No abnormality visualized. Pancreas: Visualized portion unremarkable. Spleen:  Spleen is enlarged measuring 24.0 x 10.2 x 17.4 cm. Complex cystic area in the spleen measuring up to 6.9 x 7.0 x 5.6 cm. Right Kidney: Length: 11.8. Echogenicity within normal limits. No mass or hydronephrosis visualized. Left Kidney: Length: 13.6. Echogenicity within normal limits. No mass or hydronephrosis visualized. Abdominal aorta: No aneurysm visualized. Other findings: None. IMPRESSION: 1. Enlarged spleen measuring 24.0 x 10.2 x 17.4 cm, 2218 mL. 2. Two complex cystic area in the spleen, the larger measuring up to 7.0 cm. Electronically Signed   By: Larose Hires D.O.   On: 02/14/2023 09:21   DG Chest Port 1 View  Result Date: 02/13/2023 CLINICAL DATA:  Shortness of breath EXAM: PORTABLE  CHEST 1 VIEW COMPARISON:  10/12/2012 FINDINGS: Possible small left effusion. No focal airspace disease. Normal cardiac size. No pneumothorax. IMPRESSION: Possible small left effusion. No focal airspace disease. Electronically Signed   By: Jasmine Pang M.D.   On: 02/13/2023 23:09    Microbiology: Results for orders placed or performed during the hospital encounter of 06/08/18  Urine culture     Status: Abnormal   Collection Time: 06/09/18 12:06 AM   Specimen: Urine, Clean Catch  Result Value Ref Range Status   Specimen Description   Final    URINE, CLEAN CATCH Performed at [facility], [address], [place], [place] [address]    Special Requests   Final    Normal Performed at [facility], [address] [place], [place] [address]    Culture MULTIPLE SPECIES PRESENT, SUGGEST RECOLLECTION (A)  Final   Report Status 06/12/2018 FINAL  Final    Labs: CBC: Recent Labs  Lab 02/13/23 1726 02/14/23 0820 02/14/23 2119 02/15/23 0505 02/16/23 0521 02/16/23 1500  WBC 52.9* 33.1* 31.9* 31.6* 24.3*  --   NEUTROABS 20.8* 21.8*  --  20.9* 9.8*  --   HGB 5.0* 7.0* 7.9* 8.4* 7.8* 9.3*  HCT 14.7* 21.0* 24.0* 25.1* 24.2* 27.7*  MCV 86.0 86.8 87.9 89.3 90.6  --   PLT  31* 21* 18* 18* 14*  --    Basic Metabolic Panel: Recent Labs  Lab 02/13/23 1726 02/14/23 0821 02/15/23 0505 02/16/23 0521  NA 130* 134* 136 134*  K 4.1 3.8 4.4 4.0  CL 99 106 108 107  CO2 19* 21* 20* 21*  GLUCOSE 116* 97 93 94  BUN 21* 16 21* 19  CREATININE 0.77 0.54 0.56 0.57  CALCIUM 8.0* 7.5* 7.6* 7.4*   Liver Function Tests: Recent Labs  Lab 02/13/23 1726  AST 24  ALT 13  ALKPHOS 82  BILITOT 0.6  PROT 7.3  ALBUMIN 3.5   CBG: No results for input(s): "GLUCAP" in the last 168 hours.  Discharge time spent: greater than 30 minutes.  Signed: Briant Cedar, MD [facility] 02/16/2023

## 2023-02-16 NOTE — Progress Notes (Signed)
She did have her bone marrow test yesterday.  Hopefully, we will have a preliminary result today.  I do have to worry about her being in blast phase.  She started the Scemblix.  Regarding have to clearly increase the dose.  For right now, I will put her up to 80 mg a day.  Her CBC shows a white cell count 24.3.  Hemoglobin 7.8.  Platelet count 14,000.  I do think we will going to have to transfuse her 1 unit of blood and 1 unit of platelets.  She says her left-sided abdominal pain might be a little bit better.  She is eating a little bit better.  There is no nausea or vomiting.  She has had no cough or shortness of breath.  There is been no issues with bowels or bladder.  Her vital signs show temperature 99.  Pulse 85.  Blood pressure 112/51.  Her lungs are clear bilaterally.  Cardiac exam regular rate and rhythm.  Abdomen is soft.  There is still splenomegaly.  There is no hepatomegaly.  Extremity shows no clubbing, cyanosis or edema.  Skin exam is with some scattered ecchymoses.  Neurological exam is nonfocal.   I think the real issue now is the results from the bone marrow test.  I do suspect that she might be in blast phase.  If that is the case, she will have to go to [facility] for induction chemotherapy.  I suppose that we really need to know if this is myeloid blast phase or lymphoid blast phase.  Either can occur with CML.  She wants to go home today.  I think we probably can get her home today.  She does need to be transfused.  I will give her 1 unit of blood and 1 unit of platelets.  After that, then I think she can go home.  We can await the results of the bone marrow biopsy.  I can always call her.  If we have to get her to [facility], I will take care of all this.  Again, we will increase the Scemblix to 80 mg a day.  I think the staff on [address] for such a tremendous job in caring for her.  Christin Bach, MD  Psalm 27:1

## 2023-02-16 NOTE — TOC Initial Note (Signed)
Transition of Care [facility]) - Initial/Assessment Note    Patient Details  Name: Victoria [name] MRN: [id number] Date of Birth: 1970/10/21  Transition of Care [facility]) CM/SW Contact:    Howell Rucks, RN Phone Number: 02/16/2023, 9:56 AM  Clinical Narrative:    [facility] consult received for assistance with applying for Medicaid. Met with pt in room, introduced role of TOC/NCM and to review for dc needs.  Pt showing has [employer] Medicaid Plan, pt states she was unaware, pt provided copy of insurance with number for member services to call for benefits and obtain name of her caseworker, pt voiced understanding. Pt reports her Oncologist is her PCP, she has a pharmacy, confirmed transport home at discharge. No further TOC needs identified.                Expected Discharge Plan: Home/Self Care Barriers to Discharge: Continued Medical Work up   Patient Goals and CMS Choice Patient states their goals for this hospitalization and ongoing recovery are:: Home          Expected Discharge Plan and Services   Discharge Planning Services: CM Consult   Living arrangements for the past 2 months: Single Family Home                                      Prior Living Arrangements/Services Living arrangements for the past 2 months: Single Family Home Lives with:: Minor Children Patient language and need for interpreter reviewed:: Yes Do you feel safe going back to the place where you live?: Yes      Need for Family Participation in Patient Care: Yes (Comment) Care giver support system in place?: Yes (comment)   Criminal Activity/Legal Involvement Pertinent to Current Situation/Hospitalization: No - Comment as needed  Activities of Daily Living Home Assistive Devices/Equipment: Eyeglasses ADL Screening (condition at time of admission) Patient's cognitive ability adequate to safely complete daily activities?: Yes Is the patient deaf or have difficulty hearing?: No Does the patient have  difficulty seeing, even when wearing glasses/contacts?: No Does the patient have difficulty concentrating, remembering, or making decisions?: No Patient able to express need for assistance with ADLs?: Yes Does the patient have difficulty dressing or bathing?: No Independently performs ADLs?: Yes (appropriate for developmental age) Does the patient have difficulty walking or climbing stairs?: No Weakness of Legs: None Weakness of Arms/Hands: None  Permission Sought/Granted Permission sought to share information with : Case Manager Permission granted to share information with : Yes, Verbal Permission Granted  Share Information with NAME: Fannie Knee, RN           Emotional Assessment Appearance:: Appears stated age Attitude/Demeanor/Rapport: Gracious Affect (typically observed): Accepting Orientation: : Oriented to Self, Oriented to Place, Oriented to  Time, Oriented to Situation Alcohol / Substance Use: Not Applicable Psych Involvement: No (comment)  Admission diagnosis:  Symptomatic anemia [D64.9] Acute anemia [D64.9] Patient Active Problem List   Diagnosis Date Noted   Acute anemia 02/13/2023   Thrombocytopenia (HCC) 02/13/2023   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   PCP:  Patient, No Pcp Per Pharmacy:   [facility] [place], [place] - [address] [place] [address] Phone: [phone] Fax: [phone]  [facility] - [place], [place] - [address] [place] [address] Phone: [phone] Fax: [phone]  [facility] - [place], [place] - [address] [place] [address] Phone: [phone] Fax: [phone]     Social Determinants of Health (SDOH) Social History: SDOH Screenings   Food Insecurity: No Food Insecurity (02/13/2023)  Housing: Low Risk  (02/13/2023)  Transportation Needs: No  Transportation Needs (02/13/2023)  Utilities: Not At Risk (02/13/2023)  Tobacco Use: Low Risk  (02/15/2023)   SDOH Interventions:     Readmission Risk Interventions    02/16/2023    9:54 AM  Readmission Risk Prevention Plan  Medication Screening Complete  Transportation Screening Complete

## 2023-02-17 ENCOUNTER — Other Ambulatory Visit: Payer: Self-pay | Admitting: Hematology & Oncology

## 2023-02-17 DIAGNOSIS — C9210 Chronic myeloid leukemia, BCR/ABL-positive, not having achieved remission: Secondary | ICD-10-CM

## 2023-02-17 LAB — CBC WITH DIFFERENTIAL/PLATELET
Abs Immature Granulocytes: 6.33 10*3/uL — ABNORMAL HIGH (ref 0.00–0.07)
Hemoglobin: 7.8 g/dL — ABNORMAL LOW (ref 12.0–15.0)
Lymphocytes Relative: 20 %
Lymphs Abs: 4.8 10*3/uL — ABNORMAL HIGH (ref 0.7–4.0)
MCH: 29.2 pg (ref 26.0–34.0)
MCHC: 32.2 g/dL (ref 30.0–36.0)
Monocytes Absolute: 2.8 10*3/uL — ABNORMAL HIGH (ref 0.1–1.0)
Monocytes Relative: 11 %
Neutrophils Relative %: 40 %
Platelets: 14 10*3/uL — CL (ref 150–400)
RDW: 16 % — ABNORMAL HIGH (ref 11.5–15.5)
WBC: 24.3 10*3/uL — ABNORMAL HIGH (ref 4.0–10.5)
nRBC: 0 % (ref 0.0–0.2)

## 2023-02-17 MED ORDER — ASCIMINIB HCL 40 MG PO TABS
80.0000 mg | ORAL_TABLET | Freq: Two times a day (BID) | ORAL | 3 refills | Status: DC
Start: 2023-02-17 — End: 2023-02-20

## 2023-02-18 LAB — BPAM RBC
Blood Product Expiration Date: 202406152359
ISSUE DATE / TIME: 202405241008
Unit Type and Rh: 7300

## 2023-02-18 LAB — TYPE AND SCREEN
Unit division: 0
Unit division: 0
Unit division: 0

## 2023-02-18 LAB — BPAM PLATELET PHERESIS: ISSUE DATE / TIME: 202405241404

## 2023-02-18 LAB — PREPARE PLATELET PHERESIS

## 2023-02-20 ENCOUNTER — Other Ambulatory Visit: Payer: Self-pay

## 2023-02-20 ENCOUNTER — Telehealth: Payer: Self-pay | Admitting: *Deleted

## 2023-02-20 ENCOUNTER — Other Ambulatory Visit (HOSPITAL_BASED_OUTPATIENT_CLINIC_OR_DEPARTMENT_OTHER): Payer: Self-pay

## 2023-02-20 ENCOUNTER — Telehealth: Payer: Self-pay | Admitting: Dietician

## 2023-02-20 ENCOUNTER — Other Ambulatory Visit (HOSPITAL_COMMUNITY): Payer: Self-pay

## 2023-02-20 ENCOUNTER — Telehealth: Payer: Self-pay | Admitting: Pharmacist

## 2023-02-20 ENCOUNTER — Telehealth: Payer: Self-pay

## 2023-02-20 DIAGNOSIS — C9210 Chronic myeloid leukemia, BCR/ABL-positive, not having achieved remission: Secondary | ICD-10-CM

## 2023-02-20 LAB — SURGICAL PATHOLOGY

## 2023-02-20 MED ORDER — ASCIMINIB HCL 40 MG PO TABS
80.0000 mg | ORAL_TABLET | Freq: Two times a day (BID) | ORAL | 3 refills | Status: DC
Start: 2023-02-20 — End: 2023-02-21
  Filled 2023-02-20 (×2): qty 120, 30d supply, fill #0

## 2023-02-20 NOTE — Telephone Encounter (Signed)
Per scheduling message- Tiffany - Called patient and lvm for a callback to schedule a lab only appointment. 

## 2023-02-20 NOTE — Telephone Encounter (Signed)
Patient screened on MST. First attempt to reach. Provided my cell# on voice mail to return call to set up a nutrition consult. ° °Cyndi Corinthian Kemler, RDN, LDN °Registered Dietitian, [facility] °Part Time Remote (Usual office hours: Tuesday-Thursday) °Cell: [phone]   °

## 2023-02-20 NOTE — Telephone Encounter (Signed)
Oral Oncology Pharmacist Encounter  Received new prescription for Scemblix (asciminib) for the treatment of CML, planned duration until disease progression or unacceptable drug toxicity. Of note, patient previously on Scemblix, but was lost to follow up and has not taken the last 8 months per MD note.  BMP and CBC w/ Diff from 02/16/23 assessed, noted patient with WBC 24.3 K/uL (down from 52.9 K/uL on admission), Hgb 7.8 g/dL and pltc 14 K/uL. Patient given 1 unit PRBC on 02/16/23 as well as plts on 02/16/23 as well. Will confirm Scemblix dose with MD office.  ADDENDUM: Per Dr. Myna Hidalgo on 02/21/23 patient will be on Scemblix 80 mg PO BID. Dosing per MD discretion.   Current medication list in Epic reviewed, no relevant/significant DDIs with Scemblix identified.  Evaluated chart and no patient barriers to medication adherence noted.   Prescription has been e-scribed to the [facility] for benefits analysis and approval.  Oral Oncology Clinic will continue to follow for insurance authorization, copayment issues, initial counseling and start date.  Lenord Carbo, PharmD, BCPS, [facility] Hematology/Oncology Clinical Pharmacist [facility] Oral Chemotherapy Navigation Clinics [phone] 02/20/2023 3:39 PM

## 2023-02-20 NOTE — Telephone Encounter (Signed)
Oral Oncology Patient Advocate Encounter  After completing a benefits investigation, prior authorization for Scemblix is not required at this time through [facility] [place] ([place]).  Patient's copay is $4.00.     Ardeen Fillers, CPhT Oncology Pharmacy Patient Advocate  [facility]  [phone] (phone) [phone] (fax) 02/20/2023 3:44 PM

## 2023-02-21 ENCOUNTER — Telehealth: Payer: Self-pay | Admitting: *Deleted

## 2023-02-21 ENCOUNTER — Other Ambulatory Visit: Payer: Self-pay

## 2023-02-21 ENCOUNTER — Other Ambulatory Visit (HOSPITAL_COMMUNITY): Payer: Self-pay

## 2023-02-21 MED ORDER — ASCIMINIB HCL 40 MG PO TABS
80.0000 mg | ORAL_TABLET | Freq: Two times a day (BID) | ORAL | 3 refills | Status: DC
Start: 2023-02-21 — End: 2023-06-12
  Filled 2023-02-21 (×2): qty 120, 30d supply, fill #0
  Filled 2023-03-09: qty 120, 30d supply, fill #1
  Filled 2023-04-17: qty 120, 30d supply, fill #2
  Filled 2023-05-10: qty 120, 30d supply, fill #3

## 2023-02-21 NOTE — Telephone Encounter (Signed)
Patient successfully OnBoarded and drug education provided by pharmacist. Scemblix scheduled to be shipped on 02/22/23 for delivery on 02/23/23 from [facility] to patient's address. Patient also knows to call me at [phone] with any questions or concerns regarding receiving medication or if there is any unexpected change in co-pay.    Ardeen Fillers, CPhT Oncology Pharmacy Patient Advocate  [facility]  [phone] (phone) [phone] (fax) 02/21/2023 4:09 PM

## 2023-02-21 NOTE — Telephone Encounter (Signed)
Call received from [facility] with [facility] to notify Dr. Myna Hidalgo that pt has been scheduled to see Dr. Lowell Guitar on 02/27/23.  Dr. Myna Hidalgo notified.

## 2023-02-21 NOTE — Telephone Encounter (Signed)
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  Pt is doing well. Counseled patient on administration, dosing, side effects, monitoring, drug-food interactions, safe handling, storage, and disposal.  Patient will take Scemblix 40 mg tablets, 2 tablets (80 mg total) by mouth daily. Take on an empty stomach at least 2 hours before or 1 hour after food  Patient knows to avoid grapefruit and grapefruit juice while on Scemblix.  Start date: 02/23/23  Side effects include but not limited to: decreased blood counts, rash, increased cholesterol levels, increase in LFTs, myalgias, fatigue.     Reviewed with patient importance of keeping a medication schedule and plan for any missed doses.  After discussion with patient no patient barriers to medication adherence identified.   Victoria [name] voiced understanding and appreciation. All questions answered. Medication handout provided.  Provided patient with Oral Chemotherapy Navigation Clinic phone number. Patient knows to call the office with questions or concerns.   Lenord Carbo, PharmD, BCPS, [facility] Hematology/Oncology Clinical Pharmacist [facility] Oral Chemotherapy Navigation Clinics [phone] 02/21/2023 12:29 PM

## 2023-02-22 ENCOUNTER — Telehealth: Payer: Self-pay | Admitting: *Deleted

## 2023-02-22 ENCOUNTER — Other Ambulatory Visit (HOSPITAL_COMMUNITY): Payer: Self-pay

## 2023-02-22 ENCOUNTER — Other Ambulatory Visit: Payer: Self-pay | Admitting: *Deleted

## 2023-02-22 ENCOUNTER — Other Ambulatory Visit: Payer: Self-pay

## 2023-02-22 ENCOUNTER — Inpatient Hospital Stay: Payer: Medicaid Other | Attending: Hematology & Oncology

## 2023-02-22 DIAGNOSIS — C9210 Chronic myeloid leukemia, BCR/ABL-positive, not having achieved remission: Secondary | ICD-10-CM | POA: Insufficient documentation

## 2023-02-22 LAB — CMP (CANCER CENTER ONLY)
ALT: 9 U/L (ref 0–44)
AST: 9 U/L — ABNORMAL LOW (ref 15–41)
Albumin: 4 g/dL (ref 3.5–5.0)
Alkaline Phosphatase: 59 U/L (ref 38–126)
Anion gap: 5 (ref 5–15)
BUN: 25 mg/dL — ABNORMAL HIGH (ref 6–20)
CO2: 23 mmol/L (ref 22–32)
Calcium: 8.8 mg/dL — ABNORMAL LOW (ref 8.9–10.3)
Chloride: 108 mmol/L (ref 98–111)
Creatinine: 0.49 mg/dL (ref 0.44–1.00)
GFR, Estimated: >60 mL/min
Glucose, Bld: 111 mg/dL — ABNORMAL HIGH (ref 70–99)
Potassium: 4.2 mmol/L (ref 3.5–5.1)
Sodium: 136 mmol/L (ref 135–145)
Total Bilirubin: 0.9 mg/dL (ref 0.3–1.2)
Total Protein: 7.6 g/dL (ref 6.5–8.1)

## 2023-02-22 LAB — CBC WITH DIFFERENTIAL (CANCER CENTER ONLY)
Abs Immature Granulocytes: 0.98 K/uL — ABNORMAL HIGH (ref 0.00–0.07)
Basophils Absolute: 0.1 K/uL (ref 0.0–0.1)
Basophils Relative: 1 %
Eosinophils Absolute: 0 K/uL (ref 0.0–0.5)
Eosinophils Relative: 0 %
HCT: 22.8 % — ABNORMAL LOW (ref 36.0–46.0)
Hemoglobin: 7.6 g/dL — ABNORMAL LOW (ref 12.0–15.0)
Immature Granulocytes: 21 %
Lymphocytes Relative: 33 %
Lymphs Abs: 1.6 K/uL (ref 0.7–4.0)
MCH: 28.6 pg (ref 26.0–34.0)
MCHC: 33.3 g/dL (ref 30.0–36.0)
MCV: 85.7 fL (ref 80.0–100.0)
Monocytes Absolute: 0.3 K/uL (ref 0.1–1.0)
Monocytes Relative: 5 %
Neutro Abs: 1.9 K/uL (ref 1.7–7.7)
Neutrophils Relative %: 40 %
Platelet Count: 12 K/uL — ABNORMAL LOW (ref 150–400)
RBC: 2.66 MIL/uL — ABNORMAL LOW (ref 3.87–5.11)
RDW: 15.7 % — ABNORMAL HIGH (ref 11.5–15.5)
Smear Review: NORMAL
WBC Count: 4.8 K/uL (ref 4.0–10.5)
nRBC: 0 % (ref 0.0–0.2)

## 2023-02-22 LAB — SAMPLE TO BLOOD BANK

## 2023-02-22 LAB — TYPE AND SCREEN: ABO/RH(D): B POS

## 2023-02-22 LAB — BPAM PLATELET PHERESIS

## 2023-02-22 LAB — URIC ACID: Uric Acid, Serum: 4 mg/dL (ref 2.5–7.1)

## 2023-02-22 LAB — LACTATE DEHYDROGENASE: LDH: 260 U/L — ABNORMAL HIGH (ref 98–192)

## 2023-02-22 LAB — PREPARE PLATELET PHERESIS

## 2023-02-22 LAB — SAVE SMEAR(SSMR), FOR PROVIDER SLIDE REVIEW

## 2023-02-22 NOTE — Telephone Encounter (Signed)
Call placed to patient to notify her per order of Dr. Myna Hidalgo that he would like for her to have one unit of PRBC's and one unit of platelets tomorrow, 02/23/23.  Pt states that she can be here at 10:30AM for transfusion and is appreciative of call.

## 2023-02-23 ENCOUNTER — Inpatient Hospital Stay: Payer: Medicaid Other

## 2023-02-23 DIAGNOSIS — C9210 Chronic myeloid leukemia, BCR/ABL-positive, not having achieved remission: Secondary | ICD-10-CM | POA: Diagnosis not present

## 2023-02-23 LAB — BPAM RBC: Unit Type and Rh: 7300

## 2023-02-23 LAB — PREPARE PLATELET PHERESIS: Unit division: 0

## 2023-02-23 LAB — TYPE AND SCREEN: Unit division: 0

## 2023-02-23 LAB — BPAM PLATELET PHERESIS: ISSUE DATE / TIME: 202405311050

## 2023-02-23 MED ORDER — ACETAMINOPHEN 325 MG PO TABS
650.0000 mg | ORAL_TABLET | Freq: Once | ORAL | Status: AC
  Administered 2023-02-23: 650 mg via ORAL
  Filled 2023-02-23: qty 2

## 2023-02-23 MED ORDER — DIPHENHYDRAMINE HCL 25 MG PO CAPS
25.0000 mg | ORAL_CAPSULE | Freq: Once | ORAL | Status: AC
  Administered 2023-02-23: 25 mg via ORAL
  Filled 2023-02-23: qty 1

## 2023-02-23 MED ORDER — FUROSEMIDE 10 MG/ML IJ SOLN
20.0000 mg | Freq: Once | INTRAMUSCULAR | Status: DC
  Filled 2023-02-23: qty 4

## 2023-02-24 LAB — BPAM RBC
Blood Product Expiration Date: 202406222359
ISSUE DATE / TIME: 202405310750

## 2023-02-24 LAB — BPAM PLATELET PHERESIS
Blood Product Expiration Date: 202406012359
Unit Type and Rh: 5100

## 2023-02-24 LAB — TYPE AND SCREEN: Antibody Screen: NEGATIVE

## 2023-02-24 LAB — PREPARE PLATELET PHERESIS

## 2023-02-25 LAB — BCR-ABL1, CML/ALL, PCR, QUANT
E1A2 Transcript: 0.0358 %
Interpretation (BCRAL):: POSITIVE — AB
b2a2 transcript: 82.6732 %

## 2023-02-26 ENCOUNTER — Encounter (HOSPITAL_COMMUNITY): Payer: Self-pay | Admitting: Hematology & Oncology

## 2023-02-27 ENCOUNTER — Other Ambulatory Visit: Payer: Self-pay

## 2023-02-27 DIAGNOSIS — C9210 Chronic myeloid leukemia, BCR/ABL-positive, not having achieved remission: Secondary | ICD-10-CM

## 2023-02-27 NOTE — Progress Notes (Signed)
Call from dr.powell, discussed with dr.ennever patient needs to come in this week for 1 unit of RBC and 1 unit of plt. Pt called, she will come in for type and screen tomorrow 6/5 and transfusion 6/6. Orders placed

## 2023-02-28 ENCOUNTER — Other Ambulatory Visit: Payer: Self-pay

## 2023-02-28 ENCOUNTER — Telehealth: Payer: Self-pay | Admitting: Dietician

## 2023-02-28 ENCOUNTER — Inpatient Hospital Stay: Payer: Medicaid Other | Attending: Hematology & Oncology

## 2023-02-28 ENCOUNTER — Other Ambulatory Visit (HOSPITAL_BASED_OUTPATIENT_CLINIC_OR_DEPARTMENT_OTHER): Payer: Self-pay

## 2023-02-28 DIAGNOSIS — C9210 Chronic myeloid leukemia, BCR/ABL-positive, not having achieved remission: Secondary | ICD-10-CM

## 2023-02-28 LAB — CBC WITH DIFFERENTIAL (CANCER CENTER ONLY)
Abs Immature Granulocytes: 0.09 10*3/uL — ABNORMAL HIGH (ref 0.00–0.07)
Basophils Absolute: 0.1 10*3/uL (ref 0.0–0.1)
Basophils Relative: 1 %
Eosinophils Absolute: 0 10*3/uL (ref 0.0–0.5)
Eosinophils Relative: 1 %
HCT: 21.6 % — ABNORMAL LOW (ref 36.0–46.0)
Hemoglobin: 7.4 g/dL — ABNORMAL LOW (ref 12.0–15.0)
Immature Granulocytes: 2 %
Lymphocytes Relative: 36 %
Lymphs Abs: 1.8 10*3/uL (ref 0.7–4.0)
MCH: 29.2 pg (ref 26.0–34.0)
MCHC: 34.3 g/dL (ref 30.0–36.0)
MCV: 85.4 fL (ref 80.0–100.0)
Monocytes Absolute: 0.2 10*3/uL (ref 0.1–1.0)
Monocytes Relative: 3 %
Neutro Abs: 2.8 10*3/uL (ref 1.7–7.7)
Neutrophils Relative %: 57 %
Platelet Count: 13 10*3/uL — ABNORMAL LOW (ref 150–400)
RBC: 2.53 MIL/uL — ABNORMAL LOW (ref 3.87–5.11)
RDW: 14.8 % (ref 11.5–15.5)
Smear Review: NORMAL
WBC Count: 4.9 10*3/uL (ref 4.0–10.5)
nRBC: 0 % (ref 0.0–0.2)

## 2023-02-28 LAB — TYPE AND SCREEN

## 2023-02-28 LAB — SAMPLE TO BLOOD BANK

## 2023-02-28 LAB — BPAM PLATELET PHERESIS

## 2023-02-28 LAB — PREPARE RBC (CROSSMATCH)

## 2023-02-28 NOTE — Telephone Encounter (Signed)
Patient screened on MST. Second attempt to reach. Provided my cell# on voice mail to return call to set up a nutrition consult.  Cyndi Letti Towell, RDN, LDN Registered Dietitian, [facility] Part Time Remote (Usual office hours: Tuesday-Thursday) Cell: [phone]   

## 2023-03-01 ENCOUNTER — Inpatient Hospital Stay: Payer: Medicaid Other

## 2023-03-01 DIAGNOSIS — C9210 Chronic myeloid leukemia, BCR/ABL-positive, not having achieved remission: Secondary | ICD-10-CM | POA: Diagnosis not present

## 2023-03-01 LAB — TYPE AND SCREEN
ABO/RH(D): B POS
Antibody Screen: NEGATIVE
Unit division: 0

## 2023-03-01 LAB — BPAM RBC: ISSUE DATE / TIME: 202406060808

## 2023-03-01 LAB — BPAM PLATELET PHERESIS: Blood Product Expiration Date: 202406082359

## 2023-03-01 LAB — PREPARE PLATELET PHERESIS

## 2023-03-01 MED ORDER — DIPHENHYDRAMINE HCL 25 MG PO CAPS
25.0000 mg | ORAL_CAPSULE | Freq: Once | ORAL | Status: AC
  Administered 2023-03-01: 25 mg via ORAL
  Filled 2023-03-01: qty 1

## 2023-03-01 MED ORDER — SODIUM CHLORIDE 0.9% IV SOLUTION: 250.0000 mL | Freq: Once | INTRAVENOUS | Status: DC

## 2023-03-01 MED ORDER — FUROSEMIDE 10 MG/ML IJ SOLN: 20.0000 mg | Freq: Once | INTRAMUSCULAR | Status: DC

## 2023-03-01 MED ORDER — ACETAMINOPHEN 325 MG PO TABS
650.0000 mg | ORAL_TABLET | Freq: Once | ORAL | Status: AC
  Administered 2023-03-01: 650 mg via ORAL
  Filled 2023-03-01: qty 2

## 2023-03-01 NOTE — Patient Instructions (Signed)
Blood Transfusion, Adult A blood transfusion is a procedure in which you receive blood or a type of blood cell (blood component) through an IV. You may need a blood transfusion when you have a low blood count, which is a low number of any blood cell. This may result from a bleeding disorder, illness, injury, or surgery. The blood may come from a donor, or you may be able to have your own blood collected and stored (autologous blood donation) before a planned surgery. The blood given in a transfusion may be made up of different blood components. You may receive: Red blood cells. These carry oxygen to the cells in the body. Platelets. These help your blood to clot. Plasma. This is the liquid part of your blood. It carries proteins and other substances throughout the body. White blood cells. These help you fight infections. If you have hemophilia or another clotting disorder, you may also receive other types of blood products. Depending on the type of blood product, this procedure may take 1-4 hours to complete. Tell a health care provider about: Any bleeding problems you have. Any previous reactions you have had during a blood transfusion. Any allergies you have. All medicines you are taking, including vitamins, herbs, eye drops, creams, and over-the-counter medicines. Any surgeries you have had. Any medical conditions you have. Whether you are pregnant or may be pregnant. What are the risks? Talk with your health care provider about risks. The most common problems include: A mild allergic reaction, such as red, swollen areas of skin (hives) and itching. Fever or chills. This may be the body's response to new blood cells received. This may occur during or up to 4 hours after the transfusion. More serious problems may include: A serious allergic reaction that causes difficulty breathing or swelling around the face and lips. Transfusion-associated circulatory overload (TACO), or too much fluid in  the lungs. This may cause breathing problems. Transfusion-related acute lung injury (TRALI), which causes breathing difficulty and low oxygen in the blood. This can occur within hours of the transfusion or several days later. Iron overload. This can happen after receiving many blood transfusions over a period of time. Infection or virus being transmitted. This is rare because donated blood is carefully tested before it is given. Hemolytic transfusion reaction. This is rare. It happens when the body's defense system (immune system)tries to attack the new blood cells. Symptoms may include fever, chills, nausea, low blood pressure, and low back or chest pain. Transfusion-associated graft-versus-host disease (TAGVHD). This is rare. It happens when donated cells attack the body's healthy tissues. What happens before the procedure? You will have a blood test to check your blood type. This test is done to know what kind of blood your body will accept and to match it to the donor blood. If you are going to have a planned surgery, you may be able to do an autologous blood donation. This may be done in case you need to have a transfusion. You will have your temperature, blood pressure, and pulse checked before the transfusion. If you have had an allergic reaction to a transfusion in the past, you may be given medicine to help prevent a reaction. This medicine may be given to you by mouth (orally) or through an IV. What happens during the procedure?  An IV will be inserted into one of your veins. The bag of blood will be attached to your IV. The blood will then enter through your vein. Your temperature, blood pressure,   and pulse will be monitored during the transfusion. This monitoring is done to detect early signs of a transfusion reaction. Tell your nurse right away if you have any of these symptoms during the transfusion: Shortness of breath or trouble breathing. Chest or back pain. Fever or  chills. Itching or hives. If you have any signs or symptoms of a reaction, your transfusion will be stopped and you may be given medicine. When the transfusion is complete, your IV will be removed. Pressure may be applied to the IV site for a few minutes. A bandage (dressing)will be applied. The procedure may vary among health care providers and hospitals. What happens after the procedure? Your temperature, blood pressure, pulse, breathing rate, and blood oxygen level will be monitored until you leave the hospital or clinic. Your blood may be tested to see how you have responded to the transfusion. You may be warmed with fluids or blankets to maintain a normal body temperature. If you receive your blood transfusion in an outpatient setting, you will be told whom to contact to report any reactions. Where to find more information Visit the [employer]: redcross.org Summary A blood transfusion is a procedure in which you receive blood or a type of blood cell (blood component) through an IV. The blood given in a transfusion may be made up of different blood components. You may receive red blood cells, platelets, plasma, or white blood cells depending on the condition treated. Your temperature, blood pressure, and pulse will be monitored before, during, and after the transfusion. After the transfusion, your blood may be tested to see how your body has responded. This information is not intended to replace advice given to you by your health care provider. Make sure you discuss any questions you have with your health care provider. Document Revised: 12/09/2021 Document Reviewed: 12/09/2021 Elsevier Patient Education  2024 [employer]vier Inc.   

## 2023-03-02 LAB — BPAM PLATELET PHERESIS: Unit Type and Rh: 7300

## 2023-03-02 LAB — BPAM RBC
Blood Product Expiration Date: 202406252359
Unit Type and Rh: 7300

## 2023-03-02 LAB — PREPARE PLATELET PHERESIS: Unit division: 0

## 2023-03-02 LAB — TYPE AND SCREEN

## 2023-03-05 ENCOUNTER — Other Ambulatory Visit: Payer: Self-pay | Admitting: *Deleted

## 2023-03-05 DIAGNOSIS — C9210 Chronic myeloid leukemia, BCR/ABL-positive, not having achieved remission: Secondary | ICD-10-CM

## 2023-03-06 ENCOUNTER — Inpatient Hospital Stay: Payer: Medicaid Other

## 2023-03-06 DIAGNOSIS — C9210 Chronic myeloid leukemia, BCR/ABL-positive, not having achieved remission: Secondary | ICD-10-CM

## 2023-03-06 LAB — COMPREHENSIVE METABOLIC PANEL
ALT: 7 U/L (ref 0–44)
AST: 8 U/L — ABNORMAL LOW (ref 15–41)
Albumin: 4.4 g/dL (ref 3.5–5.0)
Alkaline Phosphatase: 74 U/L (ref 38–126)
Anion gap: 7 (ref 5–15)
BUN: 20 mg/dL (ref 6–20)
CO2: 26 mmol/L (ref 22–32)
Calcium: 9.7 mg/dL (ref 8.9–10.3)
Chloride: 108 mmol/L (ref 98–111)
Creatinine, Ser: 0.48 mg/dL (ref 0.44–1.00)
GFR, Estimated: >60 mL/min (ref >60)
Glucose, Bld: 102 mg/dL — ABNORMAL HIGH (ref 70–99)
Potassium: 4.1 mmol/L (ref 3.5–5.1)
Sodium: 141 mmol/L (ref 135–145)
Total Bilirubin: 0.7 mg/dL (ref 0.3–1.2)
Total Protein: 7.9 g/dL (ref 6.5–8.1)

## 2023-03-06 LAB — BCR-ABL1 KINASE DOMAIN MUTATION ANALYSIS

## 2023-03-06 LAB — CBC WITH DIFFERENTIAL (CANCER CENTER ONLY)
Abs Immature Granulocytes: 0.04 10*3/uL (ref 0.00–0.07)
Basophils Absolute: 0 10*3/uL (ref 0.0–0.1)
Basophils Relative: 1 %
Eosinophils Absolute: 0 10*3/uL (ref 0.0–0.5)
Eosinophils Relative: 1 %
HCT: 28.3 % — ABNORMAL LOW (ref 36.0–46.0)
Hemoglobin: 9.2 g/dL — ABNORMAL LOW (ref 12.0–15.0)
Immature Granulocytes: 1 %
Lymphocytes Relative: 32 %
Lymphs Abs: 1.3 10*3/uL (ref 0.7–4.0)
MCH: 29.9 pg (ref 26.0–34.0)
MCHC: 32.5 g/dL (ref 30.0–36.0)
MCV: 91.9 fL (ref 80.0–100.0)
Monocytes Absolute: 0.1 10*3/uL (ref 0.1–1.0)
Monocytes Relative: 3 %
Neutro Abs: 2.6 10*3/uL (ref 1.7–7.7)
Neutrophils Relative %: 62 %
Platelet Count: 34 10*3/uL — ABNORMAL LOW (ref 150–400)
RBC: 3.08 MIL/uL — ABNORMAL LOW (ref 3.87–5.11)
RDW: 19.9 % — ABNORMAL HIGH (ref 11.5–15.5)
WBC Count: 4.2 10*3/uL (ref 4.0–10.5)
nRBC: 0 % (ref 0.0–0.2)

## 2023-03-06 LAB — LACTATE DEHYDROGENASE: LDH: 132 U/L (ref 98–192)

## 2023-03-06 LAB — SAMPLE TO BLOOD BANK

## 2023-03-06 LAB — SAVE SMEAR(SSMR), FOR PROVIDER SLIDE REVIEW

## 2023-03-08 ENCOUNTER — Other Ambulatory Visit (HOSPITAL_COMMUNITY): Payer: Self-pay

## 2023-03-09 ENCOUNTER — Other Ambulatory Visit: Payer: Self-pay

## 2023-03-09 ENCOUNTER — Other Ambulatory Visit (HOSPITAL_COMMUNITY): Payer: Self-pay

## 2023-03-12 ENCOUNTER — Other Ambulatory Visit: Payer: Self-pay

## 2023-03-12 ENCOUNTER — Inpatient Hospital Stay: Payer: Medicaid Other

## 2023-03-12 DIAGNOSIS — C9210 Chronic myeloid leukemia, BCR/ABL-positive, not having achieved remission: Secondary | ICD-10-CM

## 2023-03-12 LAB — CBC WITH DIFFERENTIAL (CANCER CENTER ONLY)
Abs Immature Granulocytes: 0.01 10*3/uL (ref 0.00–0.07)
Basophils Absolute: 0 10*3/uL (ref 0.0–0.1)
Basophils Relative: 1 %
Eosinophils Absolute: 0 10*3/uL (ref 0.0–0.5)
Eosinophils Relative: 0 %
HCT: 29.3 % — ABNORMAL LOW (ref 36.0–46.0)
Hemoglobin: 9.5 g/dL — ABNORMAL LOW (ref 12.0–15.0)
Immature Granulocytes: 0 %
Lymphocytes Relative: 38 %
Lymphs Abs: 1.6 10*3/uL (ref 0.7–4.0)
MCH: 30.8 pg (ref 26.0–34.0)
MCHC: 32.4 g/dL (ref 30.0–36.0)
MCV: 95.1 fL (ref 80.0–100.0)
Monocytes Absolute: 0.2 10*3/uL (ref 0.1–1.0)
Monocytes Relative: 4 %
Neutro Abs: 2.4 10*3/uL (ref 1.7–7.7)
Neutrophils Relative %: 57 %
Platelet Count: 61 10*3/uL — ABNORMAL LOW (ref 150–400)
RBC: 3.08 MIL/uL — ABNORMAL LOW (ref 3.87–5.11)
RDW: 23.4 % — ABNORMAL HIGH (ref 11.5–15.5)
WBC Count: 4.2 10*3/uL (ref 4.0–10.5)
nRBC: 0 % (ref 0.0–0.2)

## 2023-03-12 LAB — CMP (CANCER CENTER ONLY)
ALT: 10 U/L (ref 0–44)
AST: 10 U/L — ABNORMAL LOW (ref 15–41)
Albumin: 4.3 g/dL (ref 3.5–5.0)
Alkaline Phosphatase: 62 U/L (ref 38–126)
Anion gap: 8 (ref 5–15)
BUN: 26 mg/dL — ABNORMAL HIGH (ref 6–20)
CO2: 25 mmol/L (ref 22–32)
Calcium: 9.2 mg/dL (ref 8.9–10.3)
Chloride: 108 mmol/L (ref 98–111)
Creatinine: 0.8 mg/dL (ref 0.44–1.00)
GFR, Estimated: >60 mL/min (ref >60)
Glucose, Bld: 99 mg/dL (ref 70–99)
Potassium: 4.5 mmol/L (ref 3.5–5.1)
Sodium: 141 mmol/L (ref 135–145)
Total Bilirubin: 0.5 mg/dL (ref 0.3–1.2)
Total Protein: 7.4 g/dL (ref 6.5–8.1)

## 2023-03-12 LAB — SAMPLE TO BLOOD BANK

## 2023-03-13 ENCOUNTER — Inpatient Hospital Stay: Payer: Medicaid Other

## 2023-03-16 ENCOUNTER — Other Ambulatory Visit (HOSPITAL_COMMUNITY): Payer: Self-pay

## 2023-03-19 ENCOUNTER — Other Ambulatory Visit (HOSPITAL_COMMUNITY): Payer: Self-pay

## 2023-03-19 ENCOUNTER — Other Ambulatory Visit: Payer: Self-pay

## 2023-03-19 ENCOUNTER — Other Ambulatory Visit: Payer: Self-pay | Admitting: *Deleted

## 2023-03-19 DIAGNOSIS — D649 Anemia, unspecified: Secondary | ICD-10-CM

## 2023-03-19 DIAGNOSIS — C9210 Chronic myeloid leukemia, BCR/ABL-positive, not having achieved remission: Secondary | ICD-10-CM

## 2023-03-19 DIAGNOSIS — D696 Thrombocytopenia, unspecified: Secondary | ICD-10-CM

## 2023-03-20 ENCOUNTER — Inpatient Hospital Stay: Payer: Medicaid Other

## 2023-03-20 ENCOUNTER — Telehealth: Payer: Self-pay

## 2023-03-20 DIAGNOSIS — C9210 Chronic myeloid leukemia, BCR/ABL-positive, not having achieved remission: Secondary | ICD-10-CM | POA: Diagnosis not present

## 2023-03-20 DIAGNOSIS — D696 Thrombocytopenia, unspecified: Secondary | ICD-10-CM

## 2023-03-20 DIAGNOSIS — D649 Anemia, unspecified: Secondary | ICD-10-CM

## 2023-03-20 NOTE — Telephone Encounter (Signed)
Called patient and informed her of lab results per MD, per MD she can now start coming in for lab checks every other week. Patient understands and declines any other questions or concerns at this time. She was very appreciative of call. Cancelled appt on 7/2 and rescheduled for 7/9 at 11am. Pt aware.

## 2023-03-21 LAB — CBC WITH DIFFERENTIAL (CANCER CENTER ONLY)
Abs Immature Granulocytes: 0.01 10*3/uL (ref 0.00–0.07)
Basophils Absolute: 0 10*3/uL (ref 0.0–0.1)
Basophils Relative: 1 %
Eosinophils Absolute: 0 10*3/uL (ref 0.0–0.5)
Eosinophils Relative: 0 %
HCT: 32.9 % — ABNORMAL LOW (ref 36.0–46.0)
Hemoglobin: 10.8 g/dL — ABNORMAL LOW (ref 12.0–15.0)
Immature Granulocytes: 0 %
Lymphocytes Relative: 32 %
Lymphs Abs: 1.3 10*3/uL (ref 0.7–4.0)
MCH: 31.7 pg (ref 26.0–34.0)
MCHC: 32.8 g/dL (ref 30.0–36.0)
MCV: 96.5 fL (ref 80.0–100.0)
Monocytes Absolute: 0.2 10*3/uL (ref 0.1–1.0)
Monocytes Relative: 5 %
Neutro Abs: 2.6 10*3/uL (ref 1.7–7.7)
Neutrophils Relative %: 62 %
Platelet Count: 94 10*3/uL — ABNORMAL LOW (ref 150–400)
RBC: 3.41 MIL/uL — ABNORMAL LOW (ref 3.87–5.11)
RDW: 21.6 % — ABNORMAL HIGH (ref 11.5–15.5)
WBC Count: 4.1 10*3/uL (ref 4.0–10.5)
nRBC: 0 % (ref 0.0–0.2)

## 2023-03-21 LAB — CMP (CANCER CENTER ONLY)
ALT: 11 U/L (ref 0–44)
AST: 10 U/L — ABNORMAL LOW (ref 15–41)
Albumin: 4.4 g/dL (ref 3.5–5.0)
Alkaline Phosphatase: 64 U/L (ref 38–126)
Anion gap: 7 (ref 5–15)
BUN: 22 mg/dL — ABNORMAL HIGH (ref 6–20)
CO2: 26 mmol/L (ref 22–32)
Calcium: 9.6 mg/dL (ref 8.9–10.3)
Chloride: 107 mmol/L (ref 98–111)
Creatinine: 0.61 mg/dL (ref 0.44–1.00)
GFR, Estimated: >60 mL/min (ref >60)
Glucose, Bld: 97 mg/dL (ref 70–99)
Potassium: 4.2 mmol/L (ref 3.5–5.1)
Sodium: 140 mmol/L (ref 135–145)
Total Bilirubin: 0.7 mg/dL (ref 0.3–1.2)
Total Protein: 7.3 g/dL (ref 6.5–8.1)

## 2023-03-27 ENCOUNTER — Inpatient Hospital Stay: Payer: Medicaid Other

## 2023-04-02 ENCOUNTER — Other Ambulatory Visit: Payer: Self-pay | Admitting: *Deleted

## 2023-04-02 DIAGNOSIS — D649 Anemia, unspecified: Secondary | ICD-10-CM

## 2023-04-02 DIAGNOSIS — D696 Thrombocytopenia, unspecified: Secondary | ICD-10-CM

## 2023-04-02 DIAGNOSIS — C9210 Chronic myeloid leukemia, BCR/ABL-positive, not having achieved remission: Secondary | ICD-10-CM

## 2023-04-03 ENCOUNTER — Inpatient Hospital Stay: Payer: Medicaid Other | Attending: Hematology & Oncology

## 2023-04-03 ENCOUNTER — Encounter: Payer: Self-pay | Admitting: *Deleted

## 2023-04-03 DIAGNOSIS — C9210 Chronic myeloid leukemia, BCR/ABL-positive, not having achieved remission: Secondary | ICD-10-CM | POA: Insufficient documentation

## 2023-04-03 DIAGNOSIS — D696 Thrombocytopenia, unspecified: Secondary | ICD-10-CM

## 2023-04-03 DIAGNOSIS — Z79899 Other long term (current) drug therapy: Secondary | ICD-10-CM | POA: Insufficient documentation

## 2023-04-03 DIAGNOSIS — D649 Anemia, unspecified: Secondary | ICD-10-CM

## 2023-04-03 LAB — CBC WITH DIFFERENTIAL (CANCER CENTER ONLY)
Abs Immature Granulocytes: 0 10*3/uL (ref 0.00–0.07)
Basophils Absolute: 0 10*3/uL (ref 0.0–0.1)
Basophils Relative: 0 %
Eosinophils Absolute: 0 10*3/uL (ref 0.0–0.5)
Eosinophils Relative: 0 %
HCT: 37.3 % (ref 36.0–46.0)
Hemoglobin: 12.4 g/dL (ref 12.0–15.0)
Immature Granulocytes: 0 %
Lymphocytes Relative: 33 %
Lymphs Abs: 1 10*3/uL (ref 0.7–4.0)
MCH: 32.1 pg (ref 26.0–34.0)
MCHC: 33.2 g/dL (ref 30.0–36.0)
MCV: 96.6 fL (ref 80.0–100.0)
Monocytes Absolute: 0.2 10*3/uL (ref 0.1–1.0)
Monocytes Relative: 7 %
Neutro Abs: 1.8 10*3/uL (ref 1.7–7.7)
Neutrophils Relative %: 60 %
Platelet Count: 160 10*3/uL (ref 150–400)
RBC: 3.86 MIL/uL — ABNORMAL LOW (ref 3.87–5.11)
RDW: 17.6 % — ABNORMAL HIGH (ref 11.5–15.5)
WBC Count: 3 10*3/uL — ABNORMAL LOW (ref 4.0–10.5)
nRBC: 0 % (ref 0.0–0.2)

## 2023-04-03 LAB — CMP (CANCER CENTER ONLY)
ALT: 10 U/L (ref 0–44)
AST: 12 U/L — ABNORMAL LOW (ref 15–41)
Albumin: 4.9 g/dL (ref 3.5–5.0)
Alkaline Phosphatase: 61 U/L (ref 38–126)
Anion gap: 9 (ref 5–15)
BUN: 23 mg/dL — ABNORMAL HIGH (ref 6–20)
CO2: 26 mmol/L (ref 22–32)
Calcium: 10.1 mg/dL (ref 8.9–10.3)
Chloride: 105 mmol/L (ref 98–111)
Creatinine: 0.56 mg/dL (ref 0.44–1.00)
GFR, Estimated: >60 mL/min (ref >60)
Glucose, Bld: 103 mg/dL — ABNORMAL HIGH (ref 70–99)
Potassium: 4.3 mmol/L (ref 3.5–5.1)
Sodium: 140 mmol/L (ref 135–145)
Total Bilirubin: 0.9 mg/dL (ref 0.3–1.2)
Total Protein: 8.5 g/dL — ABNORMAL HIGH (ref 6.5–8.1)

## 2023-04-03 LAB — SAMPLE TO BLOOD BANK

## 2023-04-09 ENCOUNTER — Other Ambulatory Visit: Payer: Self-pay

## 2023-04-11 ENCOUNTER — Other Ambulatory Visit: Payer: Self-pay

## 2023-04-13 ENCOUNTER — Other Ambulatory Visit: Payer: Self-pay

## 2023-04-16 ENCOUNTER — Other Ambulatory Visit: Payer: Self-pay | Admitting: *Deleted

## 2023-04-16 DIAGNOSIS — D649 Anemia, unspecified: Secondary | ICD-10-CM

## 2023-04-16 DIAGNOSIS — C9210 Chronic myeloid leukemia, BCR/ABL-positive, not having achieved remission: Secondary | ICD-10-CM

## 2023-04-17 ENCOUNTER — Inpatient Hospital Stay: Payer: Medicaid Other

## 2023-04-17 ENCOUNTER — Other Ambulatory Visit: Payer: Self-pay | Admitting: *Deleted

## 2023-04-17 ENCOUNTER — Other Ambulatory Visit (HOSPITAL_COMMUNITY): Payer: Self-pay

## 2023-04-17 ENCOUNTER — Other Ambulatory Visit: Payer: Self-pay

## 2023-04-17 DIAGNOSIS — D649 Anemia, unspecified: Secondary | ICD-10-CM

## 2023-04-17 DIAGNOSIS — C9210 Chronic myeloid leukemia, BCR/ABL-positive, not having achieved remission: Secondary | ICD-10-CM | POA: Diagnosis not present

## 2023-04-17 DIAGNOSIS — D696 Thrombocytopenia, unspecified: Secondary | ICD-10-CM

## 2023-04-17 LAB — CBC WITH DIFFERENTIAL (CANCER CENTER ONLY)
Abs Immature Granulocytes: 0.01 10*3/uL (ref 0.00–0.07)
Basophils Absolute: 0 10*3/uL (ref 0.0–0.1)
Basophils Relative: 0 %
Eosinophils Absolute: 0 10*3/uL (ref 0.0–0.5)
Eosinophils Relative: 0 %
HCT: 36.2 % (ref 36.0–46.0)
Hemoglobin: 12.3 g/dL (ref 12.0–15.0)
Immature Granulocytes: 0 %
Lymphocytes Relative: 32 %
Lymphs Abs: 1.3 10*3/uL (ref 0.7–4.0)
MCH: 31.9 pg (ref 26.0–34.0)
MCHC: 34 g/dL (ref 30.0–36.0)
MCV: 93.8 fL (ref 80.0–100.0)
Monocytes Absolute: 0.3 10*3/uL (ref 0.1–1.0)
Monocytes Relative: 7 %
Neutro Abs: 2.4 10*3/uL (ref 1.7–7.7)
Neutrophils Relative %: 61 %
Platelet Count: 167 10*3/uL (ref 150–400)
RBC: 3.86 MIL/uL — ABNORMAL LOW (ref 3.87–5.11)
RDW: 15.1 % (ref 11.5–15.5)
WBC Count: 3.9 10*3/uL — ABNORMAL LOW (ref 4.0–10.5)
nRBC: 0 % (ref 0.0–0.2)

## 2023-04-17 LAB — CMP (CANCER CENTER ONLY)
ALT: 10 U/L (ref 0–44)
AST: 11 U/L — ABNORMAL LOW (ref 15–41)
Albumin: 4.7 g/dL (ref 3.5–5.0)
Alkaline Phosphatase: 57 U/L (ref 38–126)
Anion gap: 9 (ref 5–15)
BUN: 28 mg/dL — ABNORMAL HIGH (ref 6–20)
CO2: 24 mmol/L (ref 22–32)
Calcium: 9.4 mg/dL (ref 8.9–10.3)
Chloride: 107 mmol/L (ref 98–111)
Creatinine: 0.79 mg/dL (ref 0.44–1.00)
GFR, Estimated: >60 mL/min (ref >60)
Glucose, Bld: 91 mg/dL (ref 70–99)
Potassium: 3.7 mmol/L (ref 3.5–5.1)
Sodium: 140 mmol/L (ref 135–145)
Total Bilirubin: 0.8 mg/dL (ref 0.3–1.2)
Total Protein: 7.7 g/dL (ref 6.5–8.1)

## 2023-04-18 ENCOUNTER — Other Ambulatory Visit (HOSPITAL_COMMUNITY): Payer: Self-pay

## 2023-04-24 ENCOUNTER — Encounter: Payer: Self-pay | Admitting: *Deleted

## 2023-04-24 NOTE — Telephone Encounter (Signed)
Pt requested a CB about results. Advised pt of what Dr Myna Hidalgo said on the result note and pt wanted to know what her previous result # was. Advised her that she dropped from 33.5% to 3.5%. Pt stated understanding.

## 2023-04-30 ENCOUNTER — Other Ambulatory Visit: Payer: Self-pay

## 2023-04-30 DIAGNOSIS — C9210 Chronic myeloid leukemia, BCR/ABL-positive, not having achieved remission: Secondary | ICD-10-CM

## 2023-05-01 ENCOUNTER — Inpatient Hospital Stay: Payer: Medicaid Other

## 2023-05-03 ENCOUNTER — Emergency Department (HOSPITAL_COMMUNITY): Payer: Medicaid Other

## 2023-05-03 ENCOUNTER — Inpatient Hospital Stay (HOSPITAL_COMMUNITY)
Admission: EM | Admit: 2023-05-03 | Discharge: 2023-05-06 | DRG: 841 | Disposition: A | Discharge status: Home / Self Care | Payer: Medicaid Other | Attending: Internal Medicine | Admitting: Internal Medicine

## 2023-05-03 ENCOUNTER — Encounter (HOSPITAL_COMMUNITY): Payer: Self-pay

## 2023-05-03 DIAGNOSIS — C9202 Acute myeloblastic leukemia, in relapse: Secondary | ICD-10-CM

## 2023-05-03 DIAGNOSIS — J069 Acute upper respiratory infection, unspecified: Secondary | ICD-10-CM | POA: Diagnosis present

## 2023-05-03 DIAGNOSIS — D696 Thrombocytopenia, unspecified: Secondary | ICD-10-CM | POA: Diagnosis present

## 2023-05-03 DIAGNOSIS — C9210 Chronic myeloid leukemia, BCR/ABL-positive, not having achieved remission: Secondary | ICD-10-CM

## 2023-05-03 DIAGNOSIS — R5383 Other fatigue: Principal | ICD-10-CM

## 2023-05-03 DIAGNOSIS — Z1152 Encounter for screening for COVID-19: Secondary | ICD-10-CM

## 2023-05-03 DIAGNOSIS — R509 Fever, unspecified: Secondary | ICD-10-CM | POA: Diagnosis present

## 2023-05-03 DIAGNOSIS — C9212 Chronic myeloid leukemia, BCR/ABL-positive, in relapse: Principal | ICD-10-CM | POA: Diagnosis present

## 2023-05-03 DIAGNOSIS — Z79899 Other long term (current) drug therapy: Secondary | ICD-10-CM

## 2023-05-03 DIAGNOSIS — R161 Splenomegaly, not elsewhere classified: Secondary | ICD-10-CM | POA: Diagnosis not present

## 2023-05-03 DIAGNOSIS — D61818 Other pancytopenia: Secondary | ICD-10-CM | POA: Diagnosis present

## 2023-05-03 LAB — URINALYSIS, W/ REFLEX TO CULTURE (INFECTION SUSPECTED)
Bacteria, UA: NONE SEEN
Bilirubin Urine: NEGATIVE
Glucose, UA: NEGATIVE mg/dL
Ketones, ur: 20 mg/dL — AB
Leukocytes,Ua: NEGATIVE
Nitrite: NEGATIVE
Protein, ur: 30 mg/dL — AB
Specific Gravity, Urine: 1.024 (ref 1.005–1.030)
pH: 5 (ref 5.0–8.0)

## 2023-05-03 LAB — LACTIC ACID, PLASMA: Lactic Acid, Venous: 1.8 mmol/L (ref 0.5–1.9)

## 2023-05-03 LAB — CULTURE, BLOOD (ROUTINE X 2)
Special Requests: ADEQUATE
Special Requests: ADEQUATE

## 2023-05-03 LAB — COMPREHENSIVE METABOLIC PANEL
ALT: 13 U/L (ref 0–44)
AST: 23 U/L (ref 15–41)
Albumin: 4 g/dL (ref 3.5–5.0)
Alkaline Phosphatase: 48 U/L (ref 38–126)
Anion gap: 14 (ref 5–15)
BUN: 30 mg/dL — ABNORMAL HIGH (ref 6–20)
CO2: 20 mmol/L — ABNORMAL LOW (ref 22–32)
Calcium: 8.7 mg/dL — ABNORMAL LOW (ref 8.9–10.3)
Chloride: 99 mmol/L (ref 98–111)
Creatinine, Ser: 0.74 mg/dL (ref 0.44–1.00)
GFR, Estimated: >60 mL/min (ref >60)
Glucose, Bld: 122 mg/dL — ABNORMAL HIGH (ref 70–99)
Potassium: 3.7 mmol/L (ref 3.5–5.1)
Sodium: 133 mmol/L — ABNORMAL LOW (ref 135–145)
Total Bilirubin: 1.6 mg/dL — ABNORMAL HIGH (ref 0.3–1.2)
Total Protein: 7.8 g/dL (ref 6.5–8.1)

## 2023-05-03 LAB — CBC WITH DIFFERENTIAL/PLATELET
Abs Immature Granulocytes: 0.86 10*3/uL — ABNORMAL HIGH (ref 0.00–0.07)
Basophils Absolute: 0.1 10*3/uL (ref 0.0–0.1)
Basophils Relative: 1 %
Eosinophils Absolute: 0 10*3/uL (ref 0.0–0.5)
Eosinophils Relative: 0 %
HCT: 31.9 % — ABNORMAL LOW (ref 36.0–46.0)
Hemoglobin: 10.8 g/dL — ABNORMAL LOW (ref 12.0–15.0)
Immature Granulocytes: 5 %
Lymphocytes Relative: 26 %
Lymphs Abs: 4.6 10*3/uL — ABNORMAL HIGH (ref 0.7–4.0)
MCH: 30.5 pg (ref 26.0–34.0)
MCHC: 33.9 g/dL (ref 30.0–36.0)
MCV: 90.1 fL (ref 80.0–100.0)
Monocytes Absolute: 7.1 10*3/uL — ABNORMAL HIGH (ref 0.1–1.0)
Monocytes Relative: 39 %
Neutro Abs: 5.2 10*3/uL (ref 1.7–7.7)
Neutrophils Relative %: 29 %
Platelets: 77 10*3/uL — ABNORMAL LOW (ref 150–400)
RBC: 3.54 MIL/uL — ABNORMAL LOW (ref 3.87–5.11)
RDW: 14.3 % (ref 11.5–15.5)
WBC: 17.9 10*3/uL — ABNORMAL HIGH (ref 4.0–10.5)
nRBC: 0.2 % (ref 0.0–0.2)

## 2023-05-03 LAB — RESP PANEL BY RT-PCR (RSV, FLU A&B, COVID)  RVPGX2
Influenza A by PCR: NEGATIVE
Influenza B by PCR: NEGATIVE
Resp Syncytial Virus by PCR: NEGATIVE
SARS Coronavirus 2 by RT PCR: NEGATIVE

## 2023-05-03 LAB — APTT: aPTT: 30 seconds (ref 24–36)

## 2023-05-03 LAB — PROTIME-INR
INR: 1.2 (ref 0.8–1.2)
Prothrombin Time: 15.4 seconds — ABNORMAL HIGH (ref 11.4–15.2)

## 2023-05-03 LAB — MONONUCLEOSIS SCREEN: Mono Screen: NEGATIVE

## 2023-05-03 MED ORDER — ACETAMINOPHEN 500 MG PO TABS
1000.0000 mg | ORAL_TABLET | Freq: Once | ORAL | Status: AC
  Administered 2023-05-03: 1000 mg via ORAL
  Filled 2023-05-03: qty 2

## 2023-05-03 MED ORDER — ACETAMINOPHEN 650 MG RE SUPP: 650.0000 mg | Freq: Four times a day (QID) | RECTAL | Status: DC | PRN

## 2023-05-03 MED ORDER — LACTATED RINGERS IV BOLUS
1000.0000 mL | Freq: Once | INTRAVENOUS | Status: AC
  Administered 2023-05-03: 1000 mL via INTRAVENOUS

## 2023-05-03 MED ORDER — ONDANSETRON HCL 4 MG PO TABS: 4.0000 mg | ORAL_TABLET | Freq: Four times a day (QID) | ORAL | Status: DC | PRN

## 2023-05-03 MED ORDER — ONDANSETRON HCL 4 MG/2ML IJ SOLN: 4.0000 mg | Freq: Four times a day (QID) | INTRAMUSCULAR | Status: DC | PRN

## 2023-05-03 MED ORDER — ACETAMINOPHEN 325 MG PO TABS
650.0000 mg | ORAL_TABLET | Freq: Four times a day (QID) | ORAL | Status: DC | PRN
  Administered 2023-05-04 (×4): 650 mg via ORAL
  Filled 2023-05-03 (×4): qty 2

## 2023-05-03 MED ORDER — ASCIMINIB HCL 40 MG PO TABS
80.0000 mg | ORAL_TABLET | Freq: Two times a day (BID) | ORAL | Status: DC
  Administered 2023-05-04 (×2): 80 mg via ORAL

## 2023-05-03 MED ORDER — MELATONIN 5 MG PO TABS: 10.0000 mg | ORAL_TABLET | Freq: Every evening | ORAL | Status: DC | PRN

## 2023-05-03 NOTE — ED Notes (Signed)
Ultrasound at bedside when I went into run to connect patient to the monitor.

## 2023-05-03 NOTE — ED Provider Notes (Signed)
[facility] Provider Note   CSN: [id number] Arrival date & time: 05/03/23  1310     History  Chief Complaint  Patient presents with   Abdominal Pain   Fever    DIANTHA SAFIER is a 52 y.o. female.   Abdominal Pain Associated symptoms: fever   Fever 52 year old female history of CML on asciminib presenting for fatigue, fever.  Patient states since Sunday she has felt poorly.  She has had some nasal congestion, fatigue, intermittent fevers up to 104.7.  No sore throat or cough or shortness of breath.  No chest pain.  She has had some left upper quadrant pain which she attributes to prior splenomegaly which she feels like is worse.  No vomiting.  Normal bowel movements.  No pain in her right upper or right lower quadrant.  She has not had any sick contacts or travel.  She has not had any bleeding or rash.     Home Medications Prior to Admission medications   Medication Sig Start Date End Date Taking? Authorizing Provider  acetaminophen (TYLENOL) 325 MG tablet Take 650 mg by mouth as needed for mild pain or moderate pain.   Yes [provider]  asciminib hcl (SCEMBLIX) 40 MG tablet Take 2 tablets (80 mg total) by mouth 2 (two) times daily. Take on an empty stomach at least 2 hours before or 1 hour after meals. 02/21/23  Yes Josph Macho, MD      Allergies    Patient has no known allergies.    Review of Systems   Review of Systems  Constitutional:  Positive for fever.  Gastrointestinal:  Positive for abdominal pain.  Review of systems completed and notable as per HPI.  ROS otherwise negative.   Physical Exam Updated Vital Signs BP (!) 146/77   Pulse (!) 114   Temp 98.3 F (36.8 C) (Oral)   Resp (!) 24   LMP 09/22/2015   SpO2 100%  Physical Exam Vitals and nursing note reviewed.  Constitutional:      General: She is not in acute distress.    Appearance: She is well-developed.  HENT:     Head: Normocephalic  and atraumatic.  Eyes:     Conjunctiva/sclera: Conjunctivae normal.  Cardiovascular:     Rate and Rhythm: Normal rate and regular rhythm.     Heart sounds: No murmur heard. Pulmonary:     Effort: Pulmonary effort is normal. No respiratory distress.     Breath sounds: Normal breath sounds.  Abdominal:     Palpations: Abdomen is soft. There is splenomegaly. There is no hepatomegaly.     Tenderness: There is abdominal tenderness in the left upper quadrant. There is no guarding or rebound.  Musculoskeletal:        General: No swelling.     Cervical back: Neck supple.  Skin:    General: Skin is warm and dry.     Capillary Refill: Capillary refill takes less than 2 seconds.     Findings: No rash.  Neurological:     General: No focal deficit present.     Mental Status: She is alert and oriented to person, place, and time.  Psychiatric:        Mood and Affect: Mood normal.     ED Results / Procedures / Treatments   Labs (all labs ordered are listed, but only abnormal results are displayed) Labs Reviewed  COMPREHENSIVE METABOLIC PANEL - Abnormal; Notable for the following  components:      Result Value   Sodium 133 (*)    CO2 20 (*)    Glucose, Bld 122 (*)    BUN 30 (*)    Calcium 8.7 (*)    Total Bilirubin 1.6 (*)    All other components within normal limits  CBC WITH DIFFERENTIAL/PLATELET - Abnormal; Notable for the following components:   WBC 17.9 (*)    RBC 3.54 (*)    Hemoglobin 10.8 (*)    HCT 31.9 (*)    Platelets 77 (*)    Lymphs Abs 4.6 (*)    Monocytes Absolute 7.1 (*)    Abs Immature Granulocytes 0.86 (*)    All other components within normal limits  PROTIME-INR - Abnormal; Notable for the following components:   Prothrombin Time 15.4 (*)    All other components within normal limits  CULTURE, BLOOD (ROUTINE X 2)  CULTURE, BLOOD (ROUTINE X 2)  RESP PANEL BY RT-PCR (RSV, FLU A&B, COVID)  RVPGX2  APTT  MONONUCLEOSIS SCREEN  LACTIC ACID, PLASMA  URINALYSIS, W/  REFLEX TO CULTURE (INFECTION SUSPECTED)  LACTIC ACID, PLASMA  I-STAT CG4 LACTIC ACID, ED    EKG EKG Interpretation Date/Time:  Thursday May 03 2023 14:26:13 EDT Ventricular Rate:  91 PR Interval:  127 QRS Duration:  96 QT Interval:  372 QTC Calculation: 458 R Axis:   80  Text Interpretation: Sinus rhythm Confirmed by Fulton Reek [phone]) on 05/03/2023 2:28:49 PM  Radiology US Abdomen Complete  Result Date: 05/03/2023 CLINICAL DATA:  hx leukemia, feels like spleen is swollen EXAM: ABDOMEN ULTRASOUND COMPLETE COMPARISON:  Ultrasound abdomen from 02/14/2023. FINDINGS: Gallbladder: Surgically absent. Common bile duct: Diameter: Up to 7 mm, likely secondary to postcholecystectomy status. Liver: No focal lesion identified. Within normal limits in parenchymal echogenicity. Portal vein is patent on color Doppler imaging with normal direction of blood flow towards the liver. IVC: No abnormality visualized. Pancreas: Visualized portion unremarkable. Spleen: Enlarged in size measuring up to 20.9 cm in length. There is at least 1, heterogeneous, hypoechoic/anechoic 2.7 x 2.8 x 3.5 cm lesion, which is incompletely characterized on the current examination. Right Kidney: Length: 10.7 cm. Echogenicity within normal limits. No mass or hydronephrosis visualized. Left Kidney: Length: 11.7 cm. Echogenicity within normal limits. No mass or hydronephrosis visualized. Abdominal aorta: No aneurysm visualized. Other findings: None. IMPRESSION: *Moderate-to-large splenomegaly. Incompletely characterized splenic lesion measuring up to 2.7 x 2.8 x 3.5 cm. Nonemergent MRI of the abdomen with and without contrast is recommended for further evaluation. Electronically Signed   By: Jules Schick M.D.   On: 05/03/2023 14:39   DG Chest Port 1 View  Result Date: 05/03/2023 CLINICAL DATA:  hx leukemia, feels like spleen is swollen EXAM: PORTABLE CHEST 1 VIEW COMPARISON:  May 2024 FINDINGS: The cardiomediastinal silhouette is  within normal limits. No pleural effusion. No pneumothorax. No mass or consolidation. No acute osseous abnormality. IMPRESSION: No acute findings in the chest Electronically Signed   By: Olive Bass M.D.   On: 05/03/2023 13:58    Procedures Procedures    Medications Ordered in ED Medications  lactated ringers bolus 1,000 mL (1,000 mLs Intravenous New Bag/Given 05/03/23 1450)  lactated ringers bolus 1,000 mL (1,000 mLs Intravenous New Bag/Given 05/03/23 1656)    ED Course/ Medical Decision Making/ A&P  Medical Decision Making Amount and/or Complexity of Data Reviewed Labs: ordered.  Risk Decision regarding hospitalization.   Medical Decision Making:   NEEVA DELEONARDIS is a 52 y.o. female who presented to the ED today with fever, malaise.  No signs over tachycardia.  She reports significant fevers at home, though afebrile here.  She is overall well-appearing but does have significant splenomegaly on exam.  Differential including viral infection, acute leukemia, blast crisis, sepsis.  She has already received an ultrasound of her abdomen, will follow this up.  She has no other abdominal tenderness including no right upper quadrant or right lower tenderness.  Lower concern for appendicitis, cholecystitis, bowel obstruction.  No signs of CNS infection.  Chest x-ray unremarkable, no clinical signs of pneumonia.  Patient placed on continuous vitals and telemetry monitoring while in ED which was reviewed periodically.  Reviewed and confirmed nursing documentation for past medical history, family history, social history.  Initial Study Results:   Laboratory  All laboratory results reviewed.  Labs notable for thrombocytopenia, anemia compared to prior, leukocytosis, significant monocyte elevation, increased absolute immature granulocytes.  CMP notable for bicarb 20, sodium 133.  Lactic acid is normal.  EKG EKG was reviewed independently. Rate, rhythm, axis,  intervals all examined and without medically relevant abnormality. ST segments without concerns for elevations.    Radiology:  All images reviewed independently.  Splenomegaly slightly improved from prior.  Needs MRI for further evaluation, discussed with patient.  Agree with radiology report at this time.      Consults: Case discussed with oncology.   Reassessment and Plan:   On reassessment she is feeling slightly better.  She does feel some chills, will recheck her temperature.  She is still mildly tachycardic.  However care is concerning given worsening leukocytosis, lymphocytosis as well as increased monocyte value significantly.  Could represent acutely worsened leukemia.  Will plan to discuss with hematology oncology.  I discussed with Dr. Leonides Schanz with oncology.  He has low suspicion for blast crisis at this time.  Think is reasonable to observe her and watch her cultures.  I think she would benefit from admission given persistent tachycardia, significant fevers at home and unclear source.  Suspect likely viral infection given no focal symptoms.  Given she is afebrile here, normal lactic acid, without clear bacterial infection I have held on antibiotics.  I discussed the patient with the hospitalist for admission.  The patient was evaluated by the hospitalist.  Patient refused COVID testing.  After discussion with the hospitalist, patient elected to leave AGAINST MEDICAL ADVICE.  I had a discussion with the patient.  I expressed my concern that she could have underlying bacteremia, infection, acute leukemia, significant infection which could lead to worsening condition, death, permanent disability, need for hospitalization.  I also informed her of her abnormal finding of her spleen which would need an MRI at some point.  I again discussed with the hospitalist and the patient.  Patient elected to leave AGAINST MEDICAL ADVICE.  She is willing to stay for more fluids which I think is appropriate,  her urinalysis is also pending.  I discussed the patient with Dr. Donnald Garre who is going to follow-up on her urinalysis and likely discharge AGAINST MEDICAL ADVICE.   Patient's presentation is most consistent with acute presentation with potential threat to life or bodily function.           Final Clinical Impression(s) / ED Diagnoses Final diagnoses:  Other fatigue    Rx / DC  Orders ED Discharge Orders     None         Laurence Spates, MD 05/03/23 [phone]

## 2023-05-03 NOTE — H&P (Signed)
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  Patient is on numerous holistic treatments including bovine glandular tablets.  She has had some abdominal pain that started the day before yesterday.  Its gotten worse.  She has not had any nausea or vomiting.  Again she had a fever of 102 several days ago but has been afebrile today.  No diarrhea.  On arrival to the ER temp 98.3 heart rate 111 blood pressure 110/70 satting 100% on room air.  White count 17.9, hemoglobin 10.8, platelets 77K. No left shift.  Patient refused to have her nose swab for COVID/influenza, RSV  Sodium 133, potassium 3.7, chloride 20, bicarb 30, creatinine 0.7, glucose 122  INR 1.2 AST of 23, ALT of 13, alk phos of 48  Abd U/S shows splenomegaly. Non-emergent MRI abd was recommended.  Pt adamant that she was not going to have a flu/covid/swab. She did not give a reason.  [facility] hospitalist consulted.    ED Course: WBC 17.9. 29% neutrophils, lymphocytes 26%. BUN 30, Scr 0.74. Abd U/S shows splenomegaly   Review of Systems:  See ROS from consult note the same day.  Past Medical History:  Diagnosis Date   Anemia    HX   Anxiety    no meds   Chronic myelogenous leukemia (HCC) 2010   Depression    no meds   PONV (postoperative nausea and vomiting)     Past Surgical History:  Procedure Laterality Date   BREAST SURGERY     reduction   CHOLECYSTECTOMY     DILATATION & CURRETTAGE/HYSTEROSCOPY  WITH RESECTOCOPE N/A 10/27/2013   Procedure: DILATATION & CURETTAGE/HYSTEROSCOPY WITH RESECTOCOPE;  Surgeon: Genia Del, MD;  Location: [facility];  Service: Gynecology;  Laterality: N/A;   DILATION AND CURETTAGE OF UTERUS     TAB   IR BONE MARROW BIOPSY & ASPIRATION  02/15/2023     reports that she has never smoked. She has never used smokeless tobacco. She reports current alcohol use. She reports that she does not use drugs.  No Known Allergies  History reviewed. No pertinent family history.  Prior to Admission medications   Medication Sig Start Date End Date Taking? Authorizing Provider  acetaminophen (TYLENOL) 325 MG tablet Take 650 mg by mouth as needed for mild pain or moderate pain.   Yes [provider]  asciminib hcl (SCEMBLIX) 40 MG tablet Take 2 tablets (80 mg total) by mouth 2 (two) times daily. Take on an empty stomach at least 2 hours before or 1 hour after meals. 02/21/23  Yes Josph Macho, MD    Physical Exam: Vitals:   05/03/23 2030 05/03/23 2040 05/03/23 2050 05/03/23 2056  BP:   118/70 118/70  Pulse: (!) 105 (!) 105 99   Resp: 17 17 18    Temp:      TempSrc:      SpO2: 97% 97% 98%     Physical Exam  See physical exam from consult note the  same day   Labs on Admission: I have personally reviewed following labs and imaging studies  CBC: Recent Labs  Lab 05/03/23 1436  WBC 17.9*  NEUTROABS 5.2  HGB 10.8*  HCT 31.9*  MCV 90.1  PLT 77*   Basic Metabolic Panel: Recent Labs  Lab 05/03/23 1436  NA 133*  K 3.7  CL 99  CO2 20*  GLUCOSE 122*  BUN 30*  CREATININE 0.74  CALCIUM 8.7*   GFR: CrCl cannot be calculated (Unknown ideal weight.). Liver Function Tests: Recent Labs  Lab 05/03/23 1436  AST 23  ALT 13  ALKPHOS 48  BILITOT 1.6*  PROT 7.8  ALBUMIN 4.0   Coagulation Profile: Recent Labs  Lab 05/03/23 1436  INR 1.2   Covid negative, flu negative, RSV negative.  Urine analysis:    Component Value Date/Time    COLORURINE YELLOW 05/03/2023 1843   APPEARANCEUR CLEAR 05/03/2023 1843   LABSPEC 1.024 05/03/2023 1843   PHURINE 5.0 05/03/2023 1843   GLUCOSEU NEGATIVE 05/03/2023 1843   HGBUR MODERATE (A) 05/03/2023 1843   BILIRUBINUR NEGATIVE 05/03/2023 1843   KETONESUR 20 (A) 05/03/2023 1843   PROTEINUR 30 (A) 05/03/2023 1843   NITRITE NEGATIVE 05/03/2023 1843   LEUKOCYTESUR NEGATIVE 05/03/2023 1843    Radiological Exams on Admission: I have personally reviewed images US Abdomen Complete  Result Date: 05/03/2023 CLINICAL DATA:  hx leukemia, feels like spleen is swollen EXAM: ABDOMEN ULTRASOUND COMPLETE COMPARISON:  Ultrasound abdomen from 02/14/2023. FINDINGS: Gallbladder: Surgically absent. Common bile duct: Diameter: Up to 7 mm, likely secondary to postcholecystectomy status. Liver: No focal lesion identified. Within normal limits in parenchymal echogenicity. Portal vein is patent on color Doppler imaging with normal direction of blood flow towards the liver. IVC: No abnormality visualized. Pancreas: Visualized portion unremarkable. Spleen: Enlarged in size measuring up to 20.9 cm in length. There is at least 1, heterogeneous, hypoechoic/anechoic 2.7 x 2.8 x 3.5 cm lesion, which is incompletely characterized on the current examination. Right Kidney: Length: 10.7 cm. Echogenicity within normal limits. No mass or hydronephrosis visualized. Left Kidney: Length: 11.7 cm. Echogenicity within normal limits. No mass or hydronephrosis visualized. Abdominal aorta: No aneurysm visualized. Other findings: None. IMPRESSION: *Moderate-to-large splenomegaly. Incompletely characterized splenic lesion measuring up to 2.7 x 2.8 x 3.5 cm. Nonemergent MRI of the abdomen with and without contrast is recommended for further evaluation. Electronically Signed   By: Jules Schick M.D.   On: 05/03/2023 14:39   DG Chest Port 1 View  Result Date: 05/03/2023 CLINICAL DATA:  hx leukemia, feels like spleen is swollen EXAM: PORTABLE CHEST  1 VIEW COMPARISON:  May 2024 FINDINGS: The cardiomediastinal silhouette is within normal limits. No pleural effusion. No pneumothorax. No mass or consolidation. No acute osseous abnormality. IMPRESSION: No acute findings in the chest Electronically Signed   By: Olive Bass M.D.   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 Has had fever, malaise, sore throat. Abd pain for 1.5 days. Pt refusing nasal swab for covid/influenza/rsv. Explained rationale to patient about ruling in/out simple things that could cause her symptoms before advancing to other imaging modalities. Pt continues to refuse nasal swab. Pt states she feels much better after  IVF and refuses hospital admission. Pt states she will call Dr. Gustavo Lah office in the AM. Informed pt that EDP may make her sign AMA form. Pt agrees. Told the patient that it is her body and her choice.  Pt has leukocytosis but no left shift. Lymphocytes at 26%, neutrophils 29%  **update. Pt now had changed her mind and wants to be admitted. Covid/flu/rsv negative. UA negative.  Send respiratory viral panel  Splenomegaly Pt has had splenomegaly for several months.  Platelet count has increased. Was 13K... now 77K.  Worsening splenomegaly could be from viral infection....but pt refusing to allow for testing of flu/covid/rsv.  Radiology suggested non-urgent MRI. Pt declines to be admitted to hospital. She will call oncology tomorrow for outpatient appointment.  **update. Pt has changed her mind and now wants to be admitted.  Oncology to see her tomorrow.  Thrombocytopenia (HCC) Chronic. Has improved since 01-2023.  CML (chronic myelocytic  leukemia) (HCC) - since November 2008 Chronic. Has had CML since 07/2007. F/u with Dr. Myna Hidalgo.   DVT prophylaxis: SCDs Code Status: Full Code Admission status: Observation, Med-Surg   Carollee Herter, DO [facility] 05/03/2023, 9:08 PM

## 2023-05-03 NOTE — Assessment & Plan Note (Addendum)
Chronic. Has improved since 01-2023.

## 2023-05-03 NOTE — ED Provider Notes (Signed)
Patient admitted. Physical Exam  BP 118/70   Pulse 99   Temp (!) 102.1 F (38.9 C) (Oral)   Resp 18   LMP 09/22/2015   SpO2 98%   Physical Exam  Procedures  Procedures  ED Course / MDM    Medical Decision Making Amount and/or Complexity of Data Reviewed Labs: ordered.  Risk OTC drugs. Decision regarding hospitalization.   After evaluation with Dr. Imogene Burn patient had changed her mind about admission.  However recommendations at that time were to get urine and COVID testing.  I ultimately went back and reviewed negative COVID test and negative urine.  We extensively discussed the history of present illness including patient's fevers for the past 3 days and painful splenomegaly.  Patient respiked fever up to 103.2.  Ultimately after discussion and no specific diagnosis for the patient's symptoms at this time, she is now agreeable to hospitalization for observation, and further diagnostic evaluation is needed.  Patient is alert with clear mental status.  She has no respiratory distress.  She has borderline tachycardia low 100s.  Blood pressures remain normotensive.       Arby Barrette, MD 05/03/23 2103

## 2023-05-03 NOTE — Assessment & Plan Note (Signed)
Chronic. Has had CML since 07/2007. F/u with Dr. Myna Hidalgo.

## 2023-05-03 NOTE — ED Provider Triage Note (Signed)
Emergency Medicine Provider Triage Evaluation Note  BROOKE-LYNN TRUST , a 52 y.o. female  was evaluated in triage.  Pt complains of feeling sick since Monday, sinus pressure, fever, has been in bed, feels dehydrated. Max temp 104.7 last night. Hx leukemia, numbers have been good, on meds (chemo daily). Feels like spleen hurts, feels swollen.  Notes spleen hurt when she was initially diagnosed.  Review of Systems  Positive: Congestion, body aches Negative: vomiting  Physical Exam  BP 110/70 (BP Location: Left Arm)   Pulse (!) 111   Temp 98.3 F (36.8 C) (Oral)   Resp 16   LMP 09/22/2015   SpO2 100%  Gen:   Awake, no distress   Resp:  Normal effort  MSK:   Moves extremities without difficulty  Other:    Medical Decision Making  Medically screening exam initiated at 1:45 PM.  Appropriate orders placed.  AFOMIA HIRACHETA was informed that the remainder of the evaluation will be completed by another provider, this initial triage assessment does not replace that evaluation, and the importance of remaining in the ED until their evaluation is complete.     Jeannie Fend, PA-C 05/03/23 1347

## 2023-05-03 NOTE — Assessment & Plan Note (Addendum)
Pt with URI symptoms since Sunday. Has had fever, malaise, sore throat. Abd pain for 1.5 days. Pt refusing nasal swab for covid/influenza/rsv. Explained rationale to patient about ruling in/out simple things that could cause her symptoms before advancing to other imaging modalities. Pt continues to refuse nasal swab. Pt states she feels much better after IVF and refuses hospital admission. Pt states she will call Dr. Gustavo Lah office in the AM. Informed pt that EDP may make her sign AMA form. Pt agrees. Told the patient that it is her body and her choice.  Pt has leukocytosis but no left shift. Lymphocytes at 26%, neutrophils 29%  **update. Pt now had changed her mind and wants to be admitted. Covid/flu/rsv negative. UA negative.  Send respiratory viral panel

## 2023-05-03 NOTE — Consult Note (Signed)
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  Patient is on numerous holistic treatments including bovine glandular tablets.  She has had some abdominal pain that started the day before yesterday.  Its gotten worse.  She has not had any nausea or vomiting.  Again she had a fever of 102 several days ago but has been afebrile today.  No diarrhea.  On arrival to the ER temp 98.3 heart rate 111 blood pressure 110/70 satting 100% on room air.  White count 17.9, hemoglobin 10.8, platelets 77K. No left shift.  Patient refused to have her nose swab for COVID/influenza, RSV  Sodium 133, potassium 3.7, chloride 20, bicarb 30, creatinine 0.7, glucose 122  INR 1.2 AST of 23, ALT of 13, alk phos of 48  Abd U/S shows splenomegaly. Non-emergent MRI abd was recommended.  Pt adamant that she was not going to have a flu/covid/swab. She did not give a reason.  [facility] hospitalist consulted.    ED Course: afebrile, 98.3.  WBC 17.9. 29% neutrophils, lymphocytes 26%. BUN 30, Scr 0.74. Abd U/S shows splenomegaly  Review of Systems:  Review of Systems  Constitutional:  Positive for fever and malaise/fatigue.  HENT:  Positive for congestion and sore throat.   Eyes: Negative.   Respiratory:  Negative for cough, sputum production and shortness of breath.   Cardiovascular: Negative.   Gastrointestinal:  Positive for abdominal pain.       Abd pain only for 1.5 days. No  diarrhea. Mostly in LUQ  Genitourinary: Negative.   Musculoskeletal: Negative.   Neurological: Negative.   Endo/Heme/Allergies: Negative.   Psychiatric/Behavioral: Negative.      Past Medical History:  Diagnosis Date   Anemia    HX   Anxiety    no meds   Chronic myelogenous leukemia (HCC) 2010   Depression    no meds   PONV (postoperative nausea and vomiting)     Past Surgical History:  Procedure Laterality Date   BREAST SURGERY     reduction   CHOLECYSTECTOMY     DILATATION & CURRETTAGE/HYSTEROSCOPY WITH RESECTOCOPE N/A 10/27/2013   Procedure: DILATATION & CURETTAGE/HYSTEROSCOPY WITH RESECTOCOPE;  Surgeon: Genia Del, MD;  Location: [facility];  Service: Gynecology;  Laterality: N/A;   DILATION AND CURETTAGE OF UTERUS     TAB   IR BONE MARROW BIOPSY & ASPIRATION  02/15/2023     reports that she has never smoked. She has never used smokeless tobacco. She reports current alcohol use. She reports that she does not use drugs.  No Known Allergies  History reviewed. No pertinent family history.  Prior to Admission medications   Medication Sig Start Date End Date Taking? Authorizing Provider  acetaminophen (TYLENOL) 325 MG tablet Take 650 mg by mouth as needed for mild pain or moderate pain.   Yes [provider]  asciminib hcl (SCEMBLIX) 40 MG tablet Take 2 tablets (80 mg total) by mouth 2 (two) times daily. Take on an empty  stomach at least 2 hours before or 1 hour after meals. 02/21/23  Yes Victoria Macho, MD    Physical Exam: Vitals:   05/03/23 1500 05/03/23 1515 05/03/23 1600 05/03/23 1645  BP:   132/76 (!) 146/77  Pulse: 88 95 97 (!) 114  Resp: (!) 25 15 (!) 24 (!) 24  Temp:    98.3 F (36.8 C)  TempSrc:    Oral  SpO2: 97% 100% 99% 100%    Physical Exam Vitals and nursing note reviewed.  Constitutional:      General: She is not in acute distress.    Appearance: She is not ill-appearing, toxic-appearing or diaphoretic.  HENT:     Head:  Normocephalic and atraumatic.     Nose: Nose normal.  Eyes:     General: No scleral icterus. Cardiovascular:     Rate and Rhythm: Normal rate and regular rhythm.  Pulmonary:     Effort: Pulmonary effort is normal.     Breath sounds: Normal breath sounds.  Abdominal:     General: Abdomen is flat. Bowel sounds are normal.     Palpations: There is splenomegaly.  Musculoskeletal:     Right lower leg: No edema.     Left lower leg: No edema.  Skin:    General: Skin is warm and dry.     Capillary Refill: Capillary refill takes less than 2 seconds.     Comments: Multiple tattoos on both arms  Neurological:     General: No focal deficit present.     Mental Status: She is alert and oriented to person, place, and time.     Labs on Admission: I have personally reviewed following labs and imaging studies  CBC: Recent Labs  Lab 05/03/23 1436  WBC 17.9*  NEUTROABS 5.2  HGB 10.8*  HCT 31.9*  MCV 90.1  PLT 77*   Basic Metabolic Panel: Recent Labs  Lab 05/03/23 1436  NA 133*  K 3.7  CL 99  CO2 20*  GLUCOSE 122*  BUN 30*  CREATININE 0.74  CALCIUM 8.7*   GFR: CrCl cannot be calculated (Unknown ideal weight.). Liver Function Tests: Recent Labs  Lab 05/03/23 1436  AST 23  ALT 13  ALKPHOS 48  BILITOT 1.6*  PROT 7.8  ALBUMIN 4.0   Coagulation Profile: Recent Labs  Lab 05/03/23 1436  INR 1.2   Urine analysis:    Component Value Date/Time   COLORURINE YELLOW 02/13/2023 1726   APPEARANCEUR CLEAR 02/13/2023 1726   LABSPEC 1.010 02/13/2023 1726   PHURINE 6.5 02/13/2023 1726   GLUCOSEU NEGATIVE 02/13/2023 1726   HGBUR TRACE (A) 02/13/2023 1726   BILIRUBINUR NEGATIVE 02/13/2023 1726   KETONESUR NEGATIVE 02/13/2023 1726   PROTEINUR NEGATIVE 02/13/2023 1726   NITRITE NEGATIVE 02/13/2023 1726   LEUKOCYTESUR NEGATIVE 02/13/2023 1726    Radiological Exams on Admission: I have personally reviewed images US Abdomen Complete  Result Date: 05/03/2023 CLINICAL DATA:  hx  leukemia, feels like spleen is swollen EXAM: ABDOMEN ULTRASOUND COMPLETE COMPARISON:  Ultrasound abdomen from 02/14/2023. FINDINGS: Gallbladder: Surgically absent. Common bile duct: Diameter: Up to 7 mm, likely secondary to postcholecystectomy status. Liver: No focal lesion identified. Within normal limits in parenchymal echogenicity. Portal vein is patent on color Doppler imaging with normal direction of blood flow towards the liver. IVC: No abnormality visualized. Pancreas: Visualized portion unremarkable. Spleen: Enlarged in size measuring up to 20.9 cm in length. There is at least 1, heterogeneous, hypoechoic/anechoic 2.7 x 2.8 x 3.5 cm lesion, which  is incompletely characterized on the current examination. Right Kidney: Length: 10.7 cm. Echogenicity within normal limits. No mass or hydronephrosis visualized. Left Kidney: Length: 11.7 cm. Echogenicity within normal limits. No mass or hydronephrosis visualized. Abdominal aorta: No aneurysm visualized. Other findings: None. IMPRESSION: *Moderate-to-large splenomegaly. Incompletely characterized splenic lesion measuring up to 2.7 x 2.8 x 3.5 cm. Nonemergent MRI of the abdomen with and without contrast is recommended for further evaluation. Electronically Signed   By: Jules Schick M.D.   On: 05/03/2023 14:39   DG Chest Port 1 View  Result Date: 05/03/2023 CLINICAL DATA:  hx leukemia, feels like spleen is swollen EXAM: PORTABLE CHEST 1 VIEW COMPARISON:  May 2024 FINDINGS: The cardiomediastinal silhouette is within normal limits. No pleural effusion. No pneumothorax. No mass or consolidation. No acute osseous abnormality. IMPRESSION: No acute findings in the chest Electronically Signed   By: Olive Bass M.D.   On: 05/03/2023 13:58    EKG: My personal interpretation of EKG shows: NSR    Assessment/Plan Principal Problem:   Upper respiratory infection Active Problems:   Splenomegaly   CML (chronic myelocytic leukemia) (HCC) - since November 2008    Thrombocytopenia [facility])    Assessment and Plan: * Upper respiratory infection Pt with URI symptoms since Sunday. Has had fever, malaise, sore throat. Abd pain for 1.5 days. Pt refusing nasal swab for covid/influenza/rsv. Explained rationale to patient about ruling in/out simple things that could cause her symptoms before advancing to other imaging modalities. Pt continues to refuse nasal swab. Pt states she feels much better after IVF and refuses hospital admission. Pt states she will call Dr. Gustavo Lah office in the AM. Informed pt that EDP may make her sign AMA form. Pt agrees. Told the patient that it is her body and her choice.  Pt has leukocytosis but no left shift. Lymphocytes at 26%, neutrophils 29%  Splenomegaly Pt has had splenomegaly for several months.  Platelet count has increased. Was 13K... now 77K.  Worsening splenomegaly could be from viral infection....but pt refusing to allow for testing of flu/covid/rsv.  Radiology suggested non-urgent MRI. Pt declines to be admitted to hospital. She will call oncology tomorrow for outpatient appointment.  Thrombocytopenia (HCC) Chronic. Has improved since 01-2023.  CML (chronic myelocytic leukemia) (HCC) - since November 2008 Chronic. Has had CML since 07/2007. F/u with Dr. Myna [name].   Family Communication: discussed with pt and her friend [place]. Pt gave permission for friend Rolly Salter to stay in the room and listen to conversation  Disposition Plan: pt is going to sign out AMA. Pt refusing nasal swab. Discussed with EDP. Pt advised to call oncology office in the AM.  Carollee Herter, DO [facility] 05/03/2023, 6:32 PM

## 2023-05-03 NOTE — ED Notes (Signed)
Patient refused Resp panel

## 2023-05-03 NOTE — ED Triage Notes (Signed)
Pt presents with c/o left upper quad abdominal pain and fevers. Pt is diagnosed with acute leukemia but reports her numbers have been excellent over her last several blood draws. Pt reports that she does have a hx of some pain in her spleen area and she is feeling that pain as well right now.

## 2023-05-03 NOTE — Assessment & Plan Note (Addendum)
Pt has had splenomegaly for several months.  Platelet count has increased. Was 13K... now 77K.  Worsening splenomegaly could be from viral infection....but pt refusing to allow for testing of flu/covid/rsv.  Radiology suggested non-urgent MRI. Pt declines to be admitted to hospital. She will call oncology tomorrow for outpatient appointment.  **update. Pt has changed her mind and now wants to be admitted.  Oncology to see her tomorrow.

## 2023-05-03 NOTE — Subjective & Objective (Signed)
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  Patient is on numerous holistic treatments including bovine glandular tablets.  She has had some abdominal pain that started the day before yesterday.  Its gotten worse.  She has not had any nausea or vomiting.  Again she had a fever of 102 several days ago but has been afebrile today.  No diarrhea.  On arrival to the ER temp 98.3 heart rate 111 blood pressure 110/70 satting 100% on room air.  White count 17.9, hemoglobin 10.8, platelets 77K. No left shift.  Patient refused to have her nose swab for COVID/influenza, RSV  Sodium 133, potassium 3.7, chloride 20, bicarb 30, creatinine 0.7, glucose 122  INR 1.2 AST of 23, ALT of 13, alk phos of 48  Abd U/S shows splenomegaly. Non-emergent MRI abd was recommended.  Pt adamant that she was not going to have a flu/covid/swab. She did not give a reason.  [facility] hospitalist consulted.

## 2023-05-04 ENCOUNTER — Other Ambulatory Visit: Payer: Self-pay

## 2023-05-04 DIAGNOSIS — Z1152 Encounter for screening for COVID-19: Secondary | ICD-10-CM | POA: Diagnosis not present

## 2023-05-04 DIAGNOSIS — Z79899 Other long term (current) drug therapy: Secondary | ICD-10-CM | POA: Diagnosis not present

## 2023-05-04 DIAGNOSIS — R161 Splenomegaly, not elsewhere classified: Secondary | ICD-10-CM | POA: Diagnosis present

## 2023-05-04 DIAGNOSIS — J069 Acute upper respiratory infection, unspecified: Secondary | ICD-10-CM | POA: Diagnosis present

## 2023-05-04 DIAGNOSIS — R509 Fever, unspecified: Secondary | ICD-10-CM | POA: Diagnosis not present

## 2023-05-04 DIAGNOSIS — C9202 Acute myeloblastic leukemia, in relapse: Secondary | ICD-10-CM | POA: Diagnosis not present

## 2023-05-04 DIAGNOSIS — C9212 Chronic myeloid leukemia, BCR/ABL-positive, in relapse: Secondary | ICD-10-CM | POA: Diagnosis present

## 2023-05-04 DIAGNOSIS — D61818 Other pancytopenia: Secondary | ICD-10-CM | POA: Diagnosis present

## 2023-05-04 LAB — CBC WITH DIFFERENTIAL/PLATELET
Abs Immature Granulocytes: 0.1 10*3/uL — ABNORMAL HIGH (ref 0.00–0.07)
Basophils Absolute: 0 10*3/uL (ref 0.0–0.1)
Basophils Relative: 0 %
Blasts: 47 %
Eosinophils Absolute: 0 10*3/uL (ref 0.0–0.5)
Eosinophils Relative: 0 %
HCT: 22.8 % — ABNORMAL LOW (ref 36.0–46.0)
Hemoglobin: 7.8 g/dL — ABNORMAL LOW (ref 12.0–15.0)
Lymphocytes Relative: 14 %
Lymphs Abs: 1.1 10*3/uL (ref 0.7–4.0)
MCH: 30.8 pg (ref 26.0–34.0)
MCHC: 34.2 g/dL (ref 30.0–36.0)
MCV: 90.1 fL (ref 80.0–100.0)
Monocytes Absolute: 0.3 10*3/uL (ref 0.1–1.0)
Monocytes Relative: 4 %
Myelocytes: 1 %
Neutro Abs: 2.7 10*3/uL (ref 1.7–7.7)
Neutrophils Relative %: 34 %
Platelets: 43 10*3/uL — ABNORMAL LOW (ref 150–400)
RBC: 2.53 MIL/uL — ABNORMAL LOW (ref 3.87–5.11)
RDW: 14.2 % (ref 11.5–15.5)
WBC: 7.9 10*3/uL (ref 4.0–10.5)
nRBC: 0.3 % — ABNORMAL HIGH (ref 0.0–0.2)

## 2023-05-04 LAB — LACTATE DEHYDROGENASE: LDH: 500 U/L — ABNORMAL HIGH (ref 98–192)

## 2023-05-04 LAB — LACTIC ACID, PLASMA: Lactic Acid, Venous: 0.8 mmol/L (ref 0.5–1.9)

## 2023-05-04 LAB — PROCALCITONIN: Procalcitonin: 2.21 ng/mL

## 2023-05-04 LAB — PATHOLOGIST SMEAR REVIEW

## 2023-05-04 NOTE — Hospital Course (Signed)
Brief hospital course: PMH of CML with AL L blast crisis, depression presented to the hospital with complaints of fever and chills. Currently being observed.  Assessment and Plan: CML. 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 Medical oncology consulted. Able per discussion with request [facility] oncology. Currently awaiting further recommendation.  Fever of unknown origin. Viral workup is negative so far. Mononucleosis workup is also negative. Patient does have chronic splenomegaly. Will check CMV and EBV. Procalcitonin significantly elevated although can be confounded with in the setting of history of leukemia.  Acute thrombocytopenia. Acute on chronic. Will monitor for now. Oncology following.

## 2023-05-04 NOTE — Progress Notes (Signed)
Prior-To-Admission Oral Chemotherapy for Treatment of Oncologic Disease   Order noted from Dr. Imogene Burn to continue prior-to-admission oral chemotherapy regimen of asciminib.  Procedure Per Pharmacy & Therapeutics Committee Policy: Orders for continuation of home oral chemotherapy for treatment of an oncologic disease will be held unless approved by an oncologist during current admission.    For patients receiving oncology care at [facility], inpatient pharmacist contacts patient's oncologist during regular office hours to review. If earlier review is medically necessary, attending physician consults [facility] on-call oncologist   For patients receiving oncology care outside of [facility], attending physician consults patient's oncologist to review. If this oncologist or their coverage cannot be reached, attending physician consults [facility] on-call oncologist   Oral chemotherapy continuation order is on hold pending oncologist review, [facility] oncologist Dr Twanna Hy will be notified by inpatient pharmacy during office hours     Arley Phenix RPh 05/04/2023, 12:08 AM

## 2023-05-04 NOTE — Consult Note (Signed)
Victoria [name] is well-known to me.  She is a very charming 52 year old white female.  She has had a longstanding history of CML.  She subsequently has stopped taking her medications and then presented with what appeared to be a blast crisis.  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  She is on Scemblix.  Her blood counts have normalized..  She still taking the Scemblix.  2 weeks ago, she had normal blood counts.  She said that over the weekend, she began to feel more tired.  She had some aches and pains.  She began to have some temperatures.  The temperature is Going up.  She is having sweats.  She is having some chills.  She subsequently came to the ER.  She had lab work done.  This showed a white cell count of 18,000.  Hemoglobin 10.8.  Platelet count 77,000.  Her sodium 133.  Potassium 3.7.  BUN 30 creatinine 0.74.  Calcium 8.7.  Bilirubin 1.6..  She did have an ultrasound done.  She has marked splenomegaly.  She had respiratory panel done.  This is pending.  Her COVID is negative.  She has had no bleeding.  There is been no diarrhea.  She has had no cough.  So far, her temperature spike has been up to 103.3.  She has had no mouth sores.  She has had some chills but no rigors.    On her physical exam, her vital signs are temperature 99.1.  Pulse 95.  Blood pressure 120/70.  Weight is 129 pounds.  Head and neck exam shows no ocular or oral lesions.  She has no mucositis.  There is no adenopathy in the neck.  Lungs are clear bilaterally.  Cardiac exam regular rate and rhythm.  She has no murmurs.  Abdomen is soft.  She does have splenomegaly.  Her spleen tip is about 4-5 cm below the left costal margin.  There is no hepatomegaly.  Extremities shows no clubbing, cyanosis or edema.  She has maybe a little bit of tenderness over her long bones to palpation.  Neurological exam is nonfocal.  Skin exam shows no petechia.   I think that her peripheral blood smear gives [place] the  problem.  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  She has numerous blasts noted.  She has a couple nucleated red blood cells.  Her platelets are low.  I see no rouleaux formation.   Victoria [name] is a very charming 52 year old white female.  Again, I suspect that her ALL has broken through the Scemblix.  As such, I suspect that she is going need to have more aggressive therapy.  We may have to actually transfer her to [facility].  I will speak with Dr. Lowell Guitar, who is her leukemic specialist at [facility].  We will have to see what he says.  She may need to have chemotherapy.  She probably would be a candidate for CAR-T therapy.  She probably will need blinatumomab.  I probably give her some nonsteroidals to try to help with the fever.  I would hold off on antibiotics.  She does not look septic.  I would see what the cultures show first.  I really hate this for Victoria [name].  She has a daughter.  She is trying to help take care of her daughter.  She is a single mom.  We will follow along.  We will see what Dr. Lowell Guitar has to say about the possibility of getting  her over to [facility].   Christin Bach, MD  Jeri Modena 17:14

## 2023-05-04 NOTE — Progress Notes (Signed)
[facility] Progress Note Patient: Victoria [name] BJY:[id number] DOB: 06/26/1971 DOA: 05/03/2023  DOS: the patient was seen and examined on 05/04/2023  Brief hospital course: PMH of CML with AL L blast crisis, depression presented to the hospital with complaints of fever and chills. Currently being observed.  Assessment and Plan: CML. 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 Medical oncology consulted. Able per discussion with request [facility] oncology. Currently awaiting further recommendation.  Fever of unknown origin. Viral workup is negative so far. Mononucleosis workup is also negative. Patient does have chronic splenomegaly. Will check CMV and EBV. Procalcitonin significantly elevated although can be confounded with in the setting of history of leukemia.  Acute thrombocytopenia. Acute on chronic. Will monitor for now. Oncology following.   Subjective: No further fever.  No chills.  No chest pain.  Physical Exam: General: in Mild distress, No Rash Cardiovascular: S1 and S2 Present, No Murmur Respiratory: Good respiratory effort, Bilateral Air entry present. No Crackles, No wheezes Abdomen: Bowel Sound present, No tenderness Extremities: No edema Neuro: Alert and oriented x3, no new focal deficit  Data Reviewed: I have Reviewed nursing notes, Vitals, and Lab results. Since last encounter, pertinent lab results CBC and CMP   . I have ordered test including CBC and CMP  .   Disposition: Status is: Inpatient Remains inpatient appropriate because: Awaiting further workup and resolution of fever.  SCDs Start: 05/03/23 2348   Family Communication: No one at bedside Level of care: Med-Surg   Vitals:   05/04/23 0507 05/04/23 0919 05/04/23 1428 05/04/23 1811  BP: 120/70 118/63 115/62 124/64  Pulse: 95 (!) 116 97 93  Resp: 18  16 16   Temp: 99.1 F (37.3 C) 99.8 F (37.7 C) (!) 100.6 F (38.1 C) (!) 100.6 F (38.1 C)  TempSrc:    Oral  SpO2: 97% 90% 98% 97%   Weight:      Height:   5\' 5"  (1.651 m)      Author: Lynden Oxford, MD 05/04/2023 7:56 PM  Please look on [email or web address] to find out who is on call.

## 2023-05-04 NOTE — Plan of Care (Signed)
Patient awake, alert, orientx4. C/O fatigue and feeling feverish most  of the shift. T-MAX 100.6 F. Tylenol given and room kept cool. Tolerating diet; no nausea or vomiting this shift. No bleeding noted. Safety precautions maintained.  Problem: Education: Goal: Knowledge of General Education information will improve Description: Including pain rating scale, medication(s)/side effects and non-pharmacologic comfort measures Outcome: Progressing   Problem: Health Behavior/Discharge Planning: Goal: Ability to manage health-related needs will improve Outcome: Progressing   Problem: Clinical Measurements: Goal: Ability to maintain clinical measurements within normal limits will improve Outcome: Progressing Goal: Will remain free from infection Outcome: Progressing Goal: Diagnostic test results will improve Outcome: Progressing Goal: Respiratory complications will improve Outcome: Progressing Goal: Cardiovascular complication will be avoided Outcome: Progressing   Problem: Activity: Goal: Risk for activity intolerance will decrease Outcome: Progressing   Problem: Nutrition: Goal: Adequate nutrition will be maintained Outcome: Progressing   Problem: Coping: Goal: Level of anxiety will decrease Outcome: Progressing   Problem: Elimination: Goal: Will not experience complications related to bowel motility Outcome: Progressing Goal: Will not experience complications related to urinary retention Outcome: Progressing   Problem: Pain Managment: Goal: General experience of comfort will improve Outcome: Progressing   Problem: Safety: Goal: Ability to remain free from injury will improve Outcome: Progressing   Problem: Skin Integrity: Goal: Risk for impaired skin integrity will decrease Outcome: Progressing

## 2023-05-05 DIAGNOSIS — C9202 Acute myeloblastic leukemia, in relapse: Secondary | ICD-10-CM

## 2023-05-05 DIAGNOSIS — R509 Fever, unspecified: Secondary | ICD-10-CM | POA: Diagnosis not present

## 2023-05-05 LAB — CBC WITH DIFFERENTIAL/PLATELET
Abs Immature Granulocytes: 0 K/uL (ref 0.00–0.07)
Basophils Absolute: 0 K/uL (ref 0.0–0.1)
Basophils Relative: 0 %
Blasts: 40 %
Eosinophils Absolute: 0 K/uL (ref 0.0–0.5)
Eosinophils Relative: 0 %
HCT: 23.5 % — ABNORMAL LOW (ref 36.0–46.0)
Hemoglobin: 7.8 g/dL — ABNORMAL LOW (ref 12.0–15.0)
Lymphocytes Relative: 14 %
Lymphs Abs: 1.2 K/uL (ref 0.7–4.0)
MCH: 31.1 pg (ref 26.0–34.0)
MCHC: 33.2 g/dL (ref 30.0–36.0)
MCV: 93.6 fL (ref 80.0–100.0)
Monocytes Absolute: 0.7 K/uL (ref 0.1–1.0)
Monocytes Relative: 8 %
Neutro Abs: 3.3 K/uL (ref 1.7–7.7)
Neutrophils Relative %: 38 %
Platelets: 37 K/uL — ABNORMAL LOW (ref 150–400)
RBC: 2.51 MIL/uL — ABNORMAL LOW (ref 3.87–5.11)
RDW: 14.4 % (ref 11.5–15.5)
WBC: 8.8 K/uL (ref 4.0–10.5)
nRBC: 0.2 % (ref 0.0–0.2)

## 2023-05-05 MED ORDER — ACETAMINOPHEN 325 MG PO TABS
650.0000 mg | ORAL_TABLET | ORAL | Status: DC | PRN
  Administered 2023-05-05 – 2023-05-06 (×4): 650 mg via ORAL
  Filled 2023-05-05 (×5): qty 2

## 2023-05-05 MED ORDER — INDOMETHACIN 25 MG PO CAPS
75.0000 mg | ORAL_CAPSULE | Freq: Four times a day (QID) | ORAL | Status: DC | PRN
  Administered 2023-05-05 – 2023-05-06 (×2): 75 mg via ORAL
  Filled 2023-05-05 (×4): qty 3

## 2023-05-05 MED ORDER — ACETAMINOPHEN 650 MG RE SUPP: 650.0000 mg | RECTAL | Status: DC | PRN

## 2023-05-05 MED ORDER — ACETAMINOPHEN 325 MG PO TABS
325.0000 mg | ORAL_TABLET | Freq: Once | ORAL | Status: AC
  Administered 2023-05-05: 325 mg via ORAL
  Filled 2023-05-05: qty 1

## 2023-05-05 MED ORDER — INDOMETHACIN ER 75 MG PO CPCR: 75.0000 mg | ORAL_CAPSULE | Freq: Four times a day (QID) | ORAL | Status: DC | PRN

## 2023-05-05 MED ORDER — ASCIMINIB HCL 40 MG PO TABS
80.0000 mg | ORAL_TABLET | ORAL | Status: DC
  Administered 2023-05-05 – 2023-05-06 (×3): 80 mg via ORAL

## 2023-05-05 MED ORDER — ASCIMINIB HCL 40 MG PO TABS: 80.0000 mg | ORAL_TABLET | Freq: Two times a day (BID) | ORAL | Status: DC

## 2023-05-05 NOTE — Plan of Care (Signed)

## 2023-05-05 NOTE — Progress Notes (Signed)
[facility] PROGRESS NOTE  Patient: Victoria [name] YQM:[id number]   PCP: Josph Macho, MD DOB: November 06, 1970   DOA: 05/03/2023   DOS: 05/05/2023    Subjective: Denies any acute complaint.  Ongoing fever still present.  No nausea no vomiting.  Concerned with regards to timing of her medications.  No diarrhea reported.  No rash reported.  No lumps or bumps reported  Objective:  Vitals:   05/05/23 0555 05/05/23 1150 05/05/23 1155 05/05/23 1753  BP: 135/75  128/68   Pulse:   99   Resp: 19  16   Temp: 100 F (37.8 C) 100.1 F (37.8 C) (!) 101.1 F (38.4 C) 98.5 F (36.9 C)  TempSrc: Oral Oral Oral Oral  SpO2: 100%  99%   Weight:      Height:       Clear to auscultation. S1-S2 present Bowel sound present. No rash.  No edema.  Assessment and plan: CML.  Concern for acute leukemia. Management per oncology. Cultures so far negative.  Pancytopenia. In the setting of concern for leukemia. Management per oncology.  Fever. Currently supportive care only. No antibiotics.  Oncology. Monitor.  Author: Lynden Oxford, MD [facility] 05/05/2023 6:08 PM   If 7PM-7AM, please contact night-coverage at [email or web address]

## 2023-05-05 NOTE — Progress Notes (Signed)
Pt has spiked another temperature, received Tylenol x 2.  VS 97, 97 RA, 25, 118/67 (80) Temperature of 97.8.  Pt has cool washcloths on her forehead.  Pt looks to be comfortable denies pain,   A/O and able to follow commands.  [facility], NP aware.

## 2023-05-05 NOTE — Progress Notes (Signed)
So far, the cultures are negative.  I do believe that the fevers are from the leukemia.  Her CBC today shows white cell count to be 8.8.  Hemoglobin 7.8.  Platelet count is 37,000.  Sodium 136.  Potassium 3.5.  BUN 19 creatinine 0.61.  Calcium 8.1 with an albumin of 3.2.  I did speak with [facility].  They we will see her.  I think we will probably see her as an outpatient.  I suspect that they probably will see her on Tuesday.  Of note, her LDH yesterday was 500.  Everything, again, points to this being a "relapse" of the ALL that emanated out of her CML.  She feels well.  She does have some sweats.  I think her Tmax yesterday was 103.4 degrees   I would try her on Indocin.  Sometimes, this can help with these tumor fevers.  I realize that her platelet count is on the low side.  I would just make sure that the Indocin is given with food.  She has had no diarrhea.  She had a little bit of a cough.  It is nonproductive.  There is no nausea or vomiting.  Her vital signs are temperature 100 degrees.  Pulse 97.  Blood pressure 135/75.  Her head and neck exam shows no ocular or oral lesions.  There is no adenopathy in the neck.  She has no mucositis.  Lungs are clear bilaterally.  She has good breath sounds bilaterally.  Cardiac exam regular rate and rhythm.  Abdomen soft.  Bowel sounds are present.  There is splenomegaly with the spleen tip about 5 cm below the left costal margin.  Extremity shows no clubbing, cyanosis or edema.  Neurological exam is nonfocal.   I suspect that we are just dealing with relapse of the acute leukemia.  Again, cultures are all negative.  Her hemoglobin is dropped quite a bit.  Her platelet count is going down.  Again this is how she totally presented back in May.  I would watch her today.  We will see what her temperature spikes are.  We will see if Indocin can help.  I will see what her labs look like tomorrow.  Hopefully, we can let her go home tomorrow.  I do  appreciate the great care she is getting from everybody upon [address].

## 2023-05-05 NOTE — Plan of Care (Signed)
Patient awake, alert and orientx4. Denies pain. Remains on RA. T-MAX 101.1 given PRN tylenol Indocin; current temp 98.79F. Ongoing c/o fatigue; patient up sitting in chair this shift. Denies nausea and vomiting. No signs of bleeding. Safety precautions maintained.  Problem: Education: Goal: Knowledge of General Education information will improve Description: Including pain rating scale, medication(s)/side effects and non-pharmacologic comfort measures Outcome: Progressing   Problem: Health Behavior/Discharge Planning: Goal: Ability to manage health-related needs will improve Outcome: Progressing   Problem: Clinical Measurements: Goal: Ability to maintain clinical measurements within normal limits will improve Outcome: Progressing Goal: Will remain free from infection Outcome: Progressing Goal: Diagnostic test results will improve Outcome: Progressing Goal: Respiratory complications will improve Outcome: Progressing Goal: Cardiovascular complication will be avoided Outcome: Progressing   Problem: Activity: Goal: Risk for activity intolerance will decrease Outcome: Progressing   Problem: Nutrition: Goal: Adequate nutrition will be maintained Outcome: Progressing   Problem: Coping: Goal: Level of anxiety will decrease Outcome: Progressing   Problem: Elimination: Goal: Will not experience complications related to bowel motility Outcome: Progressing Goal: Will not experience complications related to urinary retention Outcome: Progressing   Problem: Pain Managment: Goal: General experience of comfort will improve Outcome: Progressing   Problem: Safety: Goal: Ability to remain free from injury will improve Outcome: Progressing   Problem: Skin Integrity: Goal: Risk for impaired skin integrity will decrease Outcome: Progressing

## 2023-05-06 DIAGNOSIS — C9212 Chronic myeloid leukemia, BCR/ABL-positive, in relapse: Secondary | ICD-10-CM

## 2023-05-06 DIAGNOSIS — R509 Fever, unspecified: Secondary | ICD-10-CM | POA: Diagnosis not present

## 2023-05-06 LAB — COMPREHENSIVE METABOLIC PANEL WITH GFR
ALT: 13 U/L (ref 0–44)
AST: 18 U/L (ref 15–41)
Albumin: 3 g/dL — ABNORMAL LOW (ref 3.5–5.0)
Alkaline Phosphatase: 40 U/L (ref 38–126)
Anion gap: 10 (ref 5–15)
BUN: 25 mg/dL — ABNORMAL HIGH (ref 6–20)
CO2: 23 mmol/L (ref 22–32)
Calcium: 7.9 mg/dL — ABNORMAL LOW (ref 8.9–10.3)
Chloride: 103 mmol/L (ref 98–111)
Creatinine, Ser: 0.55 mg/dL (ref 0.44–1.00)
GFR, Estimated: >60 mL/min (ref >60)
Glucose, Bld: 96 mg/dL (ref 70–99)
Potassium: 3.8 mmol/L (ref 3.5–5.1)
Sodium: 136 mmol/L (ref 135–145)
Total Bilirubin: 1.3 mg/dL — ABNORMAL HIGH (ref 0.3–1.2)
Total Protein: 6.4 g/dL — ABNORMAL LOW (ref 6.5–8.1)

## 2023-05-06 LAB — CBC WITH DIFFERENTIAL/PLATELET
Abs Immature Granulocytes: 0.1 10*3/uL — ABNORMAL HIGH (ref 0.00–0.07)
Basophils Absolute: 0 10*3/uL (ref 0.0–0.1)
Basophils Relative: 0 %
Blasts: 46 %
Eosinophils Absolute: 0 10*3/uL (ref 0.0–0.5)
Eosinophils Relative: 0 %
HCT: 22.2 % — ABNORMAL LOW (ref 36.0–46.0)
Hemoglobin: 7.4 g/dL — ABNORMAL LOW (ref 12.0–15.0)
Lymphocytes Relative: 12 %
Lymphs Abs: 0.6 10*3/uL — ABNORMAL LOW (ref 0.7–4.0)
MCH: 30.6 pg (ref 26.0–34.0)
MCHC: 33.3 g/dL (ref 30.0–36.0)
MCV: 91.7 fL (ref 80.0–100.0)
Monocytes Absolute: 0.2 10*3/uL (ref 0.1–1.0)
Monocytes Relative: 5 %
Myelocytes: 2 %
Neutro Abs: 1.7 10*3/uL (ref 1.7–7.7)
Neutrophils Relative %: 35 %
Platelets: 29 10*3/uL — CL (ref 150–400)
RBC: 2.42 MIL/uL — ABNORMAL LOW (ref 3.87–5.11)
RDW: 14.2 % (ref 11.5–15.5)
WBC: 4.9 10*3/uL (ref 4.0–10.5)
nRBC: 0.4 % — ABNORMAL HIGH (ref 0.0–0.2)

## 2023-05-06 LAB — BPAM PLATELET PHERESIS
Blood Product Expiration Date: 202408132359
ISSUE DATE / TIME: 202408111049
Unit Type and Rh: 7300

## 2023-05-06 LAB — TYPE AND SCREEN
ABO/RH(D): B POS
Antibody Screen: NEGATIVE
Unit division: 0

## 2023-05-06 LAB — BPAM RBC
Blood Product Expiration Date: 202409062359
ISSUE DATE / TIME: 202408111333
Unit Type and Rh: 7300

## 2023-05-06 LAB — PREPARE PLATELET PHERESIS: Unit division: 0

## 2023-05-06 LAB — PREPARE RBC (CROSSMATCH)

## 2023-05-06 MED ORDER — FAMOTIDINE 40 MG PO TABS
40.0000 mg | ORAL_TABLET | Freq: Every day | ORAL | 0 refills | Status: DC
Start: 2023-05-06 — End: 2023-06-12

## 2023-05-06 MED ORDER — FUROSEMIDE 10 MG/ML IJ SOLN: 20.0000 mg | Freq: Once | INTRAMUSCULAR | Status: DC

## 2023-05-06 MED ORDER — SODIUM CHLORIDE 0.9% IV SOLUTION: Freq: Once | INTRAVENOUS | Status: AC

## 2023-05-06 MED ORDER — INDOMETHACIN 25 MG PO CAPS: 75.0000 mg | ORAL_CAPSULE | Freq: Three times a day (TID) | ORAL | 0 refills | Status: DC | PRN

## 2023-05-06 MED ORDER — FAMOTIDINE 20 MG PO TABS
40.0000 mg | ORAL_TABLET | Freq: Two times a day (BID) | ORAL | Status: DC
  Administered 2023-05-06: 40 mg via ORAL
  Filled 2023-05-06: qty 2

## 2023-05-06 NOTE — Progress Notes (Signed)
PT Cancellation Note  Patient Details Name: Victoria [name] MRN: [id number] DOB: 04/30/1971   Cancelled Treatment:    Reason Eval/Treat Not Completed: PT screened, no needs identified, will sign off    Faye Ramsay, PT Acute Rehabilitation  Office: [phone]

## 2023-05-06 NOTE — Plan of Care (Signed)
Patient stable for discharge as per MD order. Patient verbalized understanding of discharge instructions. No signs or symptoms of acute distress at this time.  Problem: Education: Goal: Knowledge of General Education information will improve Description: Including pain rating scale, medication(s)/side effects and non-pharmacologic comfort measures Outcome: Adequate for Discharge   Problem: Health Behavior/Discharge Planning: Goal: Ability to manage health-related needs will improve Outcome: Adequate for Discharge   Problem: Clinical Measurements: Goal: Ability to maintain clinical measurements within normal limits will improve Outcome: Adequate for Discharge Goal: Will remain free from infection Outcome: Adequate for Discharge Goal: Diagnostic test results will improve Outcome: Adequate for Discharge Goal: Respiratory complications will improve Outcome: Adequate for Discharge Goal: Cardiovascular complication will be avoided Outcome: Adequate for Discharge   Problem: Activity: Goal: Risk for activity intolerance will decrease Outcome: Adequate for Discharge   Problem: Nutrition: Goal: Adequate nutrition will be maintained Outcome: Adequate for Discharge   Problem: Coping: Goal: Level of anxiety will decrease Outcome: Adequate for Discharge   Problem: Elimination: Goal: Will not experience complications related to bowel motility Outcome: Adequate for Discharge Goal: Will not experience complications related to urinary retention Outcome: Adequate for Discharge   Problem: Pain Managment: Goal: General experience of comfort will improve Outcome: Adequate for Discharge   Problem: Safety: Goal: Ability to remain free from injury will improve Outcome: Adequate for Discharge   Problem: Skin Integrity: Goal: Risk for impaired skin integrity will decrease Outcome: Adequate for Discharge

## 2023-05-06 NOTE — Progress Notes (Signed)
Victoria [name] feels okay this morning.  She did not have as much of a temperature spike yesterday.  All of her cultures are still negative.  She is eating well.  She is having a problem with going to the bathroom.  She is out of bed.  Her blood counts continue to drop.  Her hemoglobin is 7.4.  Her platelet count is 27,000.  I believe that she would benefit from 1 unit of blood and 1 unit of platelets.  I do believe that she can go home today.  I would just transfuse her and then we can see about let her go home.  She is on Indocin to help with temperature spikes.  I think this would be helpful despite the thrombocytopenia.   Her temperature is 98.3.  Pulse 84.  Blood pressure 102/59.  Her lungs are clear bilaterally.  Cardiac exam regular rate and rhythm.  There are no murmurs, rubs or bruits.  Abdomen is soft.  Bowel sounds are present.  There is no fluid wave.  She has splenomegaly with a spleen tip about 5-6 cm below the left costal margin.  Extremities shows no clubbing, cyanosis or edema.  Skin exam shows maybe some scattered ecchymoses.  Neurological exam is nonfocal.  Again, Victoria [name] has the relapse of the ALL.  I have already spoken to [facility]  They said they will try to get her in on Tuesday.  Again I do believe that she would benefit from 1 unit of blood and 1 unit of platelets.  Still let her go home today.  I would much rather have her at home then here in the hospital where she would be at high risk for infection.  We will have her labs checked tomorrow in the office.  I do appreciate the incredible care that she received from all the staff up on [address].   Christin Bach, MD  Colossians 3:23

## 2023-05-07 ENCOUNTER — Inpatient Hospital Stay: Payer: Medicaid Other | Attending: Hematology & Oncology

## 2023-05-07 ENCOUNTER — Other Ambulatory Visit: Payer: Self-pay

## 2023-05-07 DIAGNOSIS — Z452 Encounter for adjustment and management of vascular access device: Secondary | ICD-10-CM | POA: Insufficient documentation

## 2023-05-07 DIAGNOSIS — C9210 Chronic myeloid leukemia, BCR/ABL-positive, not having achieved remission: Secondary | ICD-10-CM | POA: Diagnosis present

## 2023-05-07 LAB — CMP (CANCER CENTER ONLY)
ALT: 7 U/L (ref 0–44)
AST: 16 U/L (ref 15–41)
Albumin: 3.8 g/dL (ref 3.5–5.0)
Alkaline Phosphatase: 62 U/L (ref 38–126)
Anion gap: 13 (ref 5–15)
BUN: 19 mg/dL (ref 6–20)
CO2: 22 mmol/L (ref 22–32)
Calcium: 8.4 mg/dL — ABNORMAL LOW (ref 8.9–10.3)
Chloride: 101 mmol/L (ref 98–111)
Creatinine: 0.52 mg/dL (ref 0.44–1.00)
GFR, Estimated: >60 mL/min (ref >60)
Glucose, Bld: 97 mg/dL (ref 70–99)
Potassium: 3.6 mmol/L (ref 3.5–5.1)
Sodium: 136 mmol/L (ref 135–145)
Total Bilirubin: 1.3 mg/dL — ABNORMAL HIGH (ref 0.3–1.2)
Total Protein: 7.4 g/dL (ref 6.5–8.1)

## 2023-05-07 LAB — CBC WITH DIFFERENTIAL (CANCER CENTER ONLY)
Abs Immature Granulocytes: 0.18 10*3/uL — ABNORMAL HIGH (ref 0.00–0.07)
Basophils Absolute: 0 10*3/uL (ref 0.0–0.1)
Basophils Relative: 0 %
Eosinophils Absolute: 0 10*3/uL (ref 0.0–0.5)
Eosinophils Relative: 0 %
HCT: 26.9 % — ABNORMAL LOW (ref 36.0–46.0)
Hemoglobin: 9.3 g/dL — ABNORMAL LOW (ref 12.0–15.0)
Immature Granulocytes: 5 %
Lymphocytes Relative: 52 %
Lymphs Abs: 1.8 10*3/uL (ref 0.7–4.0)
MCH: 30.5 pg (ref 26.0–34.0)
MCHC: 34.6 g/dL (ref 30.0–36.0)
MCV: 88.2 fL (ref 80.0–100.0)
Monocytes Absolute: 0.6 10*3/uL (ref 0.1–1.0)
Monocytes Relative: 16 %
Neutro Abs: 0.9 10*3/uL — ABNORMAL LOW (ref 1.7–7.7)
Neutrophils Relative %: 27 %
RBC: 3.05 MIL/uL — ABNORMAL LOW (ref 3.87–5.11)
RDW: 14 % (ref 11.5–15.5)
Smear Review: NORMAL
WBC Count: 3.4 10*3/uL — ABNORMAL LOW (ref 4.0–10.5)
nRBC: 0.6 % — ABNORMAL HIGH (ref 0.0–0.2)

## 2023-05-07 LAB — SAVE SMEAR(SSMR), FOR PROVIDER SLIDE REVIEW

## 2023-05-07 LAB — LACTATE DEHYDROGENASE: LDH: 604 U/L — ABNORMAL HIGH (ref 98–192)

## 2023-05-08 ENCOUNTER — Other Ambulatory Visit: Payer: Self-pay

## 2023-05-08 ENCOUNTER — Inpatient Hospital Stay: Payer: Medicaid Other

## 2023-05-08 DIAGNOSIS — C9210 Chronic myeloid leukemia, BCR/ABL-positive, not having achieved remission: Secondary | ICD-10-CM

## 2023-05-08 NOTE — Discharge Summary (Signed)
Physician Discharge Summary   Patient: Victoria [name] MRN: [id number] DOB: [birth year]  Admit date:     05/03/2023  Discharge date: 05/06/2023  Discharge Physician: Lynden Oxford  PCP: Josph Macho, MD  Recommendations at discharge: Follow-up with oncology tomorrow.   Follow-up Information     Josph Macho, MD. Schedule an appointment as soon as possible for a visit in 1 day(s).   Specialty: Oncology Why: with CBC lab to look at blood counts Contact information: 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 Pro-Cal was elevated.  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  Patient will follow-up with oncology outpatient.  Assessment and Plan: CML. 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 Medical oncology consulted.  Patient received blood transfusion for thrombocytopenia as well as anemia and was discharged home with plans for close follow-up with medical oncology.  Fever of unknown origin. Viral workup is negative so far. Mononucleosis workup is also negative. Patient does have chronic splenomegaly. CMV and EBV IgM negative.  IgG positive.  No evidence of active infection. Procalcitonin significantly elevated although can be confounded with in the setting of history of leukemia.  Acute thrombocytopenia. Acute on chronic. No active bleeding.  Patient received platelet transfusion. Plan for outpatient follow-up labs tomorrow.  Consultants:  Medical oncology  Procedures performed:  PRBC and platelet transfusion.  DISCHARGE  MEDICATION: Allergies as of 05/06/2023   No Known Allergies      Medication List     TAKE these medications    acetaminophen 325 MG tablet Commonly known as: TYLENOL Take 650 mg by mouth as needed for mild pain or moderate pain.   famotidine 40 MG tablet Commonly known as: PEPCID Take 1 tablet (40 mg total) by mouth daily.   indomethacin 25 MG capsule Commonly known as: INDOCIN Take 3 capsules (75 mg total) by mouth 3 (three) times daily as needed (for fever >101).   Scemblix 40 MG tablet Generic drug: asciminib hcl Take 2 tablets (80 mg total) by mouth 2 (two) times daily. Take on an empty stomach at least 2 hours before or 1 hour after meals.       Disposition: Home Diet recommendation: Regular diet  Discharge Exam: Vitals:   05/06/23 1346 05/06/23 1359 05/06/23 1507 05/06/23 1612  BP: 110/67 108/68 116/73 114/76  Pulse: 82 84 83 80  Resp: 18 18  20   Temp: 97.8 F (36.6 C) 97.9 F (36.6 C) 97.8 F (36.6 C) 97.8 F (36.6 C)  TempSrc:  Oral  Oral  SpO2: 100% 100% 100% 100%  Weight:      Height:       General: Appear in no distress; no visible Abnormal Neck Mass Or lumps, Conjunctiva normal Cardiovascular: S1 and S2 Present, no Murmur, Respiratory: good respiratory effort, Bilateral Air entry present and CTA, no Crackles, no wheezes Abdomen: Bowel Sound present, Non tender  Extremities: no Pedal edema Neurology: alert and oriented to time, place, and person  [employer]   05/04/23  0500 05/05/23 0500 05/06/23 0500  Weight: 58.5 kg 56.9 kg 56.5 kg   Condition at discharge: stable  The results of significant diagnostics from this hospitalization (including imaging, microbiology, ancillary and laboratory) are listed below for reference.   Imaging Studies: US Abdomen Complete  Result Date: 05/03/2023 CLINICAL DATA:  hx leukemia, feels like spleen is swollen EXAM: ABDOMEN ULTRASOUND COMPLETE COMPARISON:  Ultrasound abdomen from 02/14/2023. FINDINGS:  Gallbladder: Surgically absent. Common bile duct: Diameter: Up to 7 mm, likely secondary to postcholecystectomy status. Liver: No focal lesion identified. Within normal limits in parenchymal echogenicity. Portal vein is patent on color Doppler imaging with normal direction of blood flow towards the liver. IVC: No abnormality visualized. Pancreas: Visualized portion unremarkable. Spleen: Enlarged in size measuring up to 20.9 cm in length. There is at least 1, heterogeneous, hypoechoic/anechoic 2.7 x 2.8 x 3.5 cm lesion, which is incompletely characterized on the current examination. Right Kidney: Length: 10.7 cm. Echogenicity within normal limits. No mass or hydronephrosis visualized. Left Kidney: Length: 11.7 cm. Echogenicity within normal limits. No mass or hydronephrosis visualized. Abdominal aorta: No aneurysm visualized. Other findings: None. IMPRESSION: *Moderate-to-large splenomegaly. Incompletely characterized splenic lesion measuring up to 2.7 x 2.8 x 3.5 cm. Nonemergent MRI of the abdomen with and without contrast is recommended for further evaluation. Electronically Signed   By: Jules Schick M.D.   On: 05/03/2023 14:39   DG Chest Port 1 View  Result Date: 05/03/2023 CLINICAL DATA:  hx leukemia, feels like spleen is swollen EXAM: PORTABLE CHEST 1 VIEW COMPARISON:  May 2024 FINDINGS: The cardiomediastinal silhouette is within normal limits. No pleural effusion. No pneumothorax. No mass or consolidation. No acute osseous abnormality. IMPRESSION: No acute findings in the chest Electronically Signed   By: Olive Bass M.D.   On: 05/03/2023 13:58    Microbiology: Results for orders placed or performed during the hospital encounter of 05/03/23  Blood Culture (routine x 2)     Status: None   Collection Time: 05/03/23  2:36 PM   Specimen: BLOOD RIGHT ARM  Result Value Ref Range Status   Specimen Description   Final    BLOOD RIGHT ARM Performed at [facility], [address],  [place], [place] [address]    Special Requests   Final    BOTTLES DRAWN AEROBIC AND ANAEROBIC Blood Culture adequate volume Performed at [facility], [address], [place], [place] [address]    Culture   Final    NO GROWTH 5 DAYS Performed at [facility], [address] [place], [place] [address]    Report Status 05/08/2023 FINAL  Final  Blood Culture (routine x 2)     Status: None   Collection Time: 05/03/23  2:36 PM   Specimen: BLOOD LEFT HAND  Result Value Ref Range Status   Specimen Description   Final    BLOOD LEFT HAND Performed at [facility], [address], [place], [place] [address]    Special Requests   Final    BOTTLES DRAWN AEROBIC AND ANAEROBIC Blood Culture adequate volume Performed at [facility], [address], [place], [place] [address]    Culture   Final    NO GROWTH 5 DAYS Performed at [facility], [address], [place], [place] [address]    Report Status 05/08/2023 FINAL  Final  Resp panel by RT-PCR (RSV, Flu A&B, Covid) Anterior Nasal Swab     Status: None  Collection Time: 05/03/23  7:16 PM   Specimen: Anterior Nasal Swab  Result Value Ref Range Status   SARS Coronavirus 2 by RT PCR NEGATIVE NEGATIVE Final    Comment: (NOTE) SARS-CoV-2 target nucleic acids are NOT DETECTED.  The SARS-CoV-2 RNA is generally detectable in upper respiratory specimens during the acute phase of infection. The lowest concentration of SARS-CoV-2 viral copies this assay can detect is 138 copies/mL. A negative result does not preclude SARS-Cov-2 infection and should not be used as the sole basis for treatment or other patient management decisions. A negative result may occur with  improper specimen collection/handling, submission of specimen other than nasopharyngeal swab, presence of viral mutation(s) within the areas targeted by this assay, and inadequate number of viral copies(<138 copies/mL). A negative result  must be combined with clinical observations, patient history, and epidemiological information. The expected result is Negative.  Fact Sheet for Patients:  BloggerCourse.com  Fact Sheet for Healthcare Providers:  SeriousBroker.it  This test is no t yet approved or cleared by the [place] FDA and  has been authorized for detection and/or diagnosis of SARS-CoV-2 by FDA under an Emergency Use Authorization (EUA). This EUA will remain  in effect (meaning this test can be used) for the duration of the COVID-19 declaration under Section 564(b)(1) of the Act, 21 U.S.C.section 360bbb-3(b)(1), unless the authorization is terminated  or revoked sooner.       Influenza A by PCR NEGATIVE NEGATIVE Final   Influenza B by PCR NEGATIVE NEGATIVE Final    Comment: (NOTE) The Xpert Xpress SARS-CoV-2/FLU/RSV plus assay is intended as an aid in the diagnosis of influenza from Nasopharyngeal swab specimens and should not be used as a sole basis for treatment. Nasal washings and aspirates are unacceptable for Xpert Xpress SARS-CoV-2/FLU/RSV testing.  Fact Sheet for Patients: BloggerCourse.com  Fact Sheet for Healthcare Providers: SeriousBroker.it  This test is not yet approved or cleared by the [place] FDA and has been authorized for detection and/or diagnosis of SARS-CoV-2 by FDA under an Emergency Use Authorization (EUA). This EUA will remain in effect (meaning this test can be used) for the duration of the COVID-19 declaration under Section 564(b)(1) of the Act, 21 U.S.C. section 360bbb-3(b)(1), unless the authorization is terminated or revoked.     Resp Syncytial Virus by PCR NEGATIVE NEGATIVE Final    Comment: (NOTE) Fact Sheet for Patients: BloggerCourse.com  Fact Sheet for Healthcare Providers: SeriousBroker.it  This test is  not yet approved or cleared by the [place] FDA and has been authorized for detection and/or diagnosis of SARS-CoV-2 by FDA under an Emergency Use Authorization (EUA). This EUA will remain in effect (meaning this test can be used) for the duration of the COVID-19 declaration under Section 564(b)(1) of the Act, 21 U.S.C. section 360bbb-3(b)(1), unless the authorization is terminated or revoked.  Performed at [facility], [address] [place], [place] [address]   Respiratory (~20 pathogens) panel by PCR     Status: None   Collection Time: 05/03/23  7:16 PM   Specimen: Nasopharyngeal Swab; Respiratory  Result Value Ref Range Status   Adenovirus NOT DETECTED NOT DETECTED Final   Coronavirus 229E NOT DETECTED NOT DETECTED Final    Comment: (NOTE) The Coronavirus on the Respiratory Panel, DOES NOT test for the novel  Coronavirus (2019 nCoV)    Coronavirus HKU1 NOT DETECTED NOT DETECTED Final   Coronavirus NL63 NOT DETECTED NOT DETECTED Final   Coronavirus OC43 NOT DETECTED NOT DETECTED Final  Metapneumovirus NOT DETECTED NOT DETECTED Final   Rhinovirus / Enterovirus NOT DETECTED NOT DETECTED Final   Influenza A NOT DETECTED NOT DETECTED Final   Influenza B NOT DETECTED NOT DETECTED Final   Parainfluenza Virus 1 NOT DETECTED NOT DETECTED Final   Parainfluenza Virus 2 NOT DETECTED NOT DETECTED Final   Parainfluenza Virus 3 NOT DETECTED NOT DETECTED Final   Parainfluenza Virus 4 NOT DETECTED NOT DETECTED Final   Respiratory Syncytial Virus NOT DETECTED NOT DETECTED Final   Bordetella pertussis NOT DETECTED NOT DETECTED Final   Bordetella Parapertussis NOT DETECTED NOT DETECTED Final   Chlamydophila pneumoniae NOT DETECTED NOT DETECTED Final   Mycoplasma pneumoniae NOT DETECTED NOT DETECTED Final    Comment: Performed at [facility], [address], [place], [place] [address]   Labs: CBC: Recent Labs  Lab 05/03/23 1436 05/04/23 1054 05/05/23 0320  05/06/23 0314  WBC 17.9* 7.9 8.8 4.9  NEUTROABS 7.2 2.7 3.3 1.7  HGB 10.8* 7.8* 7.8* 7.4*  HCT 31.9* 22.8* 23.5* 22.2*  MCV 90.1 90.1 93.6 91.7  PLT 77* 43* 37* 29*   Basic Metabolic Panel: Recent Labs  Lab 05/03/23 1436 05/04/23 0338 05/05/23 0320 05/06/23 0314  NA 133* 131* 136 136  K 3.7 3.6 3.5 3.8  CL 99 100 102 103  CO2 20* 21* 23 23  GLUCOSE 122* 118* 103* 96  BUN 30* 24* 19 25*  CREATININE 0.74 0.59 0.61 0.55  CALCIUM 8.7* 7.8* 8.1* 7.9*  MG  --  2.1  --   --    Liver Function Tests: Recent Labs  Lab 05/03/23 1436 05/04/23 0338 05/05/23 0320 05/06/23 0314  AST 23 17 18 18   ALT 13 12 12 13   ALKPHOS 48 37* 39 40  BILITOT 1.6* 1.3* 1.3* 1.3*  PROT 7.8 6.7 6.7 6.4*  ALBUMIN 4.0 3.3* 3.2* 3.0*   CBG: No results for input(s): "GLUCAP" in the last 168 hours.  Discharge time spent: greater than 30 minutes.  Author: Lynden Oxford, MD  [facility] 05/06/2023

## 2023-05-09 ENCOUNTER — Other Ambulatory Visit: Payer: Medicaid Other

## 2023-05-09 DIAGNOSIS — Z452 Encounter for adjustment and management of vascular access device: Secondary | ICD-10-CM | POA: Diagnosis not present

## 2023-05-09 DIAGNOSIS — C9210 Chronic myeloid leukemia, BCR/ABL-positive, not having achieved remission: Secondary | ICD-10-CM

## 2023-05-09 LAB — CBC WITH DIFFERENTIAL (CANCER CENTER ONLY)
Abs Immature Granulocytes: 0 10*3/uL (ref 0.00–0.07)
Basophils Absolute: 0 10*3/uL (ref 0.0–0.1)
Basophils Relative: 0 %
Blasts: 26 %
Eosinophils Absolute: 0 10*3/uL (ref 0.0–0.5)
Eosinophils Relative: 0 %
HCT: 23.9 % — ABNORMAL LOW (ref 36.0–46.0)
Hemoglobin: 8.2 g/dL — ABNORMAL LOW (ref 12.0–15.0)
Lymphocytes Relative: 39 %
Lymphs Abs: 0.6 10*3/uL — ABNORMAL LOW (ref 0.7–4.0)
MCH: 30.3 pg (ref 26.0–34.0)
MCHC: 34.3 g/dL (ref 30.0–36.0)
MCV: 88.2 fL (ref 80.0–100.0)
Monocytes Absolute: 0 10*3/uL — ABNORMAL LOW (ref 0.1–1.0)
Monocytes Relative: 3 %
Neutro Abs: 0.5 10*3/uL — ABNORMAL LOW (ref 1.7–7.7)
Neutrophils Relative %: 32 %
Platelet Count: 35 10*3/uL — ABNORMAL LOW (ref 150–400)
Platelet Count: 24 10*3/uL — ABNORMAL LOW (ref 150–400)
RBC: 2.71 MIL/uL — ABNORMAL LOW (ref 3.87–5.11)
RDW: 14.2 % (ref 11.5–15.5)
Smear Review: NORMAL
WBC Count: 1.6 10*3/uL — ABNORMAL LOW (ref 4.0–10.5)
nRBC: 0 % (ref 0.0–0.2)

## 2023-05-09 LAB — CMP (CANCER CENTER ONLY)
ALT: <5 U/L (ref 0–44)
AST: 15 U/L (ref 15–41)
Albumin: 3.6 g/dL (ref 3.5–5.0)
Alkaline Phosphatase: 53 U/L (ref 38–126)
Anion gap: 8 (ref 5–15)
BUN: 21 mg/dL — ABNORMAL HIGH (ref 6–20)
CO2: 23 mmol/L (ref 22–32)
Calcium: 8.2 mg/dL — ABNORMAL LOW (ref 8.9–10.3)
Chloride: 104 mmol/L (ref 98–111)
Creatinine: 0.51 mg/dL (ref 0.44–1.00)
GFR, Estimated: >60 mL/min (ref >60)
Glucose, Bld: 106 mg/dL — ABNORMAL HIGH (ref 70–99)
Potassium: 4.1 mmol/L (ref 3.5–5.1)
Sodium: 135 mmol/L (ref 135–145)
Total Bilirubin: 1.3 mg/dL — ABNORMAL HIGH (ref 0.3–1.2)
Total Protein: 6.9 g/dL (ref 6.5–8.1)

## 2023-05-09 LAB — LACTATE DEHYDROGENASE: LDH: 631 U/L — ABNORMAL HIGH (ref 98–192)

## 2023-05-10 ENCOUNTER — Other Ambulatory Visit: Payer: Self-pay

## 2023-05-10 ENCOUNTER — Other Ambulatory Visit (HOSPITAL_COMMUNITY): Payer: Self-pay

## 2023-05-11 ENCOUNTER — Telehealth: Payer: Self-pay

## 2023-05-11 NOTE — Telephone Encounter (Signed)
Friend of patient showed up in lobby stating Asana is at home, extremely weak and having a lot of diarrhea.  She has barely eaten in 4 days.  After speaking with Dr. Myna Hidalgo, I advised her friend to take her by ambulance to [facility], where she is already scheduled to see Dr. Lowell Guitar today.  She stated she would do that.

## 2023-05-11 NOTE — Telephone Encounter (Signed)
Zakariah's friend Marylu Lund just came back stating 911 said Ximena was stable and did not need an ambulance.  Marylu Lund will take her to the ER but asked again about taking her to [facility] or [facility] ER.  I told her she still needs to go to [facility] to the ER there and we will put in a call to Dr. Lowell Guitar letting him know she is on her way.

## 2023-05-14 ENCOUNTER — Other Ambulatory Visit (HOSPITAL_COMMUNITY): Payer: Self-pay

## 2023-05-18 ENCOUNTER — Telehealth: Payer: Self-pay

## 2023-05-18 ENCOUNTER — Other Ambulatory Visit: Payer: Self-pay

## 2023-05-18 DIAGNOSIS — C9210 Chronic myeloid leukemia, BCR/ABL-positive, not having achieved remission: Secondary | ICD-10-CM

## 2023-05-18 NOTE — Telephone Encounter (Signed)
Received phone call from Marthann Schiller RN for [facility] working with Dr. Lowell Guitar in the inpatient setting. Christy RN called stating that patient is currently admitted and platelets are in the single digit. Neysa Bonito RN stated that patient is stating she is going to leave the hospital on Sunday AMA. Christy RN stated Dr. Lowell Guitar would like for Jaonna to stay admitted but if possible to have a lab and infusion appointment on Monday 05/21/2023 if she still decides to leave AMA. Appointments made and Dr. Myna Hidalgo aware.  Neysa Bonito RN stated she would call to update if patient decides to stay admitted.

## 2023-05-21 ENCOUNTER — Inpatient Hospital Stay: Payer: Medicaid Other

## 2023-05-21 VITALS — BP 118/68 | HR 85 | Temp 97.9°F | Resp 18

## 2023-05-21 DIAGNOSIS — Z452 Encounter for adjustment and management of vascular access device: Secondary | ICD-10-CM

## 2023-05-21 DIAGNOSIS — C9210 Chronic myeloid leukemia, BCR/ABL-positive, not having achieved remission: Secondary | ICD-10-CM

## 2023-05-21 LAB — CBC WITH DIFFERENTIAL (CANCER CENTER ONLY)
Abs Immature Granulocytes: 0.04 10*3/uL (ref 0.00–0.07)
Basophils Absolute: 0 10*3/uL (ref 0.0–0.1)
Basophils Relative: 0 %
Eosinophils Absolute: 0.1 10*3/uL (ref 0.0–0.5)
Eosinophils Relative: 2 %
HCT: 31.9 % — ABNORMAL LOW (ref 36.0–46.0)
Hemoglobin: 10.8 g/dL — ABNORMAL LOW (ref 12.0–15.0)
Immature Granulocytes: 1 %
Lymphocytes Relative: 38 %
Lymphs Abs: 1.4 10*3/uL (ref 0.7–4.0)
MCH: 30.3 pg (ref 26.0–34.0)
MCHC: 33.9 g/dL (ref 30.0–36.0)
MCV: 89.4 fL (ref 80.0–100.0)
Monocytes Absolute: 0.2 10*3/uL (ref 0.1–1.0)
Monocytes Relative: 5 %
Neutro Abs: 2 10*3/uL (ref 1.7–7.7)
Neutrophils Relative %: 54 %
Platelet Count: 14 10*3/uL — ABNORMAL LOW (ref 150–400)
RBC: 3.57 MIL/uL — ABNORMAL LOW (ref 3.87–5.11)
RDW: 16.9 % — ABNORMAL HIGH (ref 11.5–15.5)
WBC Count: 3.6 10*3/uL — ABNORMAL LOW (ref 4.0–10.5)
nRBC: 0 % (ref 0.0–0.2)

## 2023-05-21 LAB — CMP (CANCER CENTER ONLY)
ALT: 13 U/L (ref 0–44)
AST: 9 U/L — ABNORMAL LOW (ref 15–41)
Albumin: 4.1 g/dL (ref 3.5–5.0)
Alkaline Phosphatase: 61 U/L (ref 38–126)
Anion gap: 10 (ref 5–15)
BUN: 17 mg/dL (ref 6–20)
CO2: 25 mmol/L (ref 22–32)
Calcium: 9.4 mg/dL (ref 8.9–10.3)
Chloride: 103 mmol/L (ref 98–111)
Creatinine: 0.44 mg/dL (ref 0.44–1.00)
GFR, Estimated: >60 mL/min (ref >60)
Glucose, Bld: 86 mg/dL (ref 70–99)
Potassium: 3.8 mmol/L (ref 3.5–5.1)
Sodium: 138 mmol/L (ref 135–145)
Total Bilirubin: 1 mg/dL (ref 0.3–1.2)
Total Protein: 7.5 g/dL (ref 6.5–8.1)

## 2023-05-21 LAB — FIBRINOGEN: Fibrinogen: 600 mg/dL — ABNORMAL HIGH (ref 210–475)

## 2023-05-21 MED ORDER — HEPARIN SOD (PORK) LOCK FLUSH 100 UNIT/ML IV SOLN: 250.0000 [IU] | INTRAVENOUS | Status: DC | PRN

## 2023-05-21 MED ORDER — SODIUM CHLORIDE 0.9% FLUSH
10.0000 mL | INTRAVENOUS | Status: AC | PRN
  Administered 2023-05-21: 10 mL

## 2023-05-21 NOTE — Progress Notes (Signed)
Reached patient's nurse navigator Elisabeth Pigeon over the phone. She faxed Victoria [name]'s PICC line placement report. Tip location: [place].

## 2023-05-21 NOTE — Progress Notes (Signed)
No documentation on the PICC line placement found. Flushed both lumens with 10 ml of normal saline. Good blood return on both. Date on the dressing was 05/15/23, but patient has an appoint for tomorrow with [facility] where the PICC line was placed and will have the dressing changed then

## 2023-05-24 ENCOUNTER — Inpatient Hospital Stay: Payer: Medicaid Other

## 2023-05-24 ENCOUNTER — Other Ambulatory Visit: Payer: Medicaid Other

## 2023-05-29 ENCOUNTER — Inpatient Hospital Stay: Payer: Medicaid Other | Attending: Hematology & Oncology

## 2023-05-29 ENCOUNTER — Inpatient Hospital Stay: Payer: Medicaid Other | Admitting: Hematology & Oncology

## 2023-05-29 ENCOUNTER — Inpatient Hospital Stay: Payer: Medicaid Other

## 2023-05-29 DIAGNOSIS — Z79624 Long term (current) use of inhibitors of nucleotide synthesis: Secondary | ICD-10-CM | POA: Insufficient documentation

## 2023-05-29 DIAGNOSIS — Z452 Encounter for adjustment and management of vascular access device: Secondary | ICD-10-CM | POA: Insufficient documentation

## 2023-05-29 DIAGNOSIS — C9210 Chronic myeloid leukemia, BCR/ABL-positive, not having achieved remission: Secondary | ICD-10-CM | POA: Insufficient documentation

## 2023-06-04 ENCOUNTER — Other Ambulatory Visit (HOSPITAL_COMMUNITY): Payer: Self-pay

## 2023-06-06 ENCOUNTER — Ambulatory Visit: Payer: Medicaid Other | Admitting: Hematology & Oncology

## 2023-06-06 ENCOUNTER — Other Ambulatory Visit (HOSPITAL_COMMUNITY): Payer: Self-pay

## 2023-06-07 ENCOUNTER — Other Ambulatory Visit: Payer: Self-pay | Admitting: *Deleted

## 2023-06-07 DIAGNOSIS — D649 Anemia, unspecified: Secondary | ICD-10-CM

## 2023-06-07 DIAGNOSIS — C9210 Chronic myeloid leukemia, BCR/ABL-positive, not having achieved remission: Secondary | ICD-10-CM

## 2023-06-07 DIAGNOSIS — Z452 Encounter for adjustment and management of vascular access device: Secondary | ICD-10-CM

## 2023-06-07 DIAGNOSIS — D696 Thrombocytopenia, unspecified: Secondary | ICD-10-CM

## 2023-06-08 ENCOUNTER — Other Ambulatory Visit (HOSPITAL_COMMUNITY): Payer: Self-pay

## 2023-06-12 ENCOUNTER — Inpatient Hospital Stay: Payer: Medicaid Other

## 2023-06-12 ENCOUNTER — Inpatient Hospital Stay (HOSPITAL_BASED_OUTPATIENT_CLINIC_OR_DEPARTMENT_OTHER): Payer: Medicaid Other | Admitting: Medical Oncology

## 2023-06-12 ENCOUNTER — Other Ambulatory Visit: Payer: Self-pay

## 2023-06-12 ENCOUNTER — Encounter: Payer: Self-pay | Admitting: Medical Oncology

## 2023-06-12 VITALS — BP 115/69 | HR 80 | Temp 97.6°F | Resp 18 | Ht 65.0 in | Wt 123.7 lb

## 2023-06-12 DIAGNOSIS — Z452 Encounter for adjustment and management of vascular access device: Secondary | ICD-10-CM | POA: Diagnosis present

## 2023-06-12 DIAGNOSIS — D696 Thrombocytopenia, unspecified: Secondary | ICD-10-CM

## 2023-06-12 DIAGNOSIS — Z79624 Long term (current) use of inhibitors of nucleotide synthesis: Secondary | ICD-10-CM | POA: Diagnosis not present

## 2023-06-12 DIAGNOSIS — C9210 Chronic myeloid leukemia, BCR/ABL-positive, not having achieved remission: Secondary | ICD-10-CM | POA: Diagnosis not present

## 2023-06-12 DIAGNOSIS — D649 Anemia, unspecified: Secondary | ICD-10-CM

## 2023-06-12 LAB — CMP (CANCER CENTER ONLY)
ALT: 9 U/L (ref 0–44)
AST: 11 U/L — ABNORMAL LOW (ref 15–41)
Albumin: 4.3 g/dL (ref 3.5–5.0)
Alkaline Phosphatase: 58 U/L (ref 38–126)
Anion gap: 7 (ref 5–15)
BUN: 19 mg/dL (ref 6–20)
CO2: 25 mmol/L (ref 22–32)
Calcium: 9.3 mg/dL (ref 8.9–10.3)
Chloride: 105 mmol/L (ref 98–111)
Creatinine: 0.45 mg/dL (ref 0.44–1.00)
GFR, Estimated: >60 mL/min (ref >60)
Glucose, Bld: 86 mg/dL (ref 70–99)
Potassium: 4 mmol/L (ref 3.5–5.1)
Sodium: 137 mmol/L (ref 135–145)
Total Bilirubin: 0.4 mg/dL (ref 0.3–1.2)
Total Protein: 7.2 g/dL (ref 6.5–8.1)

## 2023-06-12 LAB — CBC WITH DIFFERENTIAL (CANCER CENTER ONLY)
Abs Immature Granulocytes: 0.01 10*3/uL (ref 0.00–0.07)
Basophils Absolute: 0 10*3/uL (ref 0.0–0.1)
Basophils Relative: 0 %
Eosinophils Absolute: 0 10*3/uL (ref 0.0–0.5)
Eosinophils Relative: 0 %
HCT: 30.4 % — ABNORMAL LOW (ref 36.0–46.0)
Hemoglobin: 9.6 g/dL — ABNORMAL LOW (ref 12.0–15.0)
Immature Granulocytes: 0 %
Lymphocytes Relative: 29 %
Lymphs Abs: 0.9 10*3/uL (ref 0.7–4.0)
MCH: 28.7 pg (ref 26.0–34.0)
MCHC: 31.6 g/dL (ref 30.0–36.0)
MCV: 90.7 fL (ref 80.0–100.0)
Monocytes Absolute: 0.3 10*3/uL (ref 0.1–1.0)
Monocytes Relative: 10 %
Neutro Abs: 1.9 10*3/uL (ref 1.7–7.7)
Neutrophils Relative %: 61 %
Platelet Count: 173 10*3/uL (ref 150–400)
RBC: 3.35 MIL/uL — ABNORMAL LOW (ref 3.87–5.11)
RDW: 20.1 % — ABNORMAL HIGH (ref 11.5–15.5)
WBC Count: 3.1 10*3/uL — ABNORMAL LOW (ref 4.0–10.5)
nRBC: 0 % (ref 0.0–0.2)

## 2023-06-12 LAB — MAGNESIUM: Magnesium: 2.3 mg/dL (ref 1.7–2.4)

## 2023-06-12 NOTE — Progress Notes (Unsigned)
Hematology and Oncology Follow Up Visit  Victoria [name] [id number] 11/10/1970 52 y.o. 06/12/2023   Principle Diagnosis:  Chronic Myeloid Leukemia -- Relapsed  Current Therapy:   Scemblix 40 mg po q day -- changed on 11/09/2021     Interim History:  Victoria [name] is back for a follow-up. We last saw her 6 months ago in Feb 2024.   Sine her last visit she has been seen by her specialist at [facility]. She was taken off of the Scemblix due to toxicity concerns (diarrhea) and started her on Icluisig.*** She reports that she is tolerating this medication well.   She has a bone marrow biopsy on Thursday at [facility].   Her last BCR/ABL was >55% on 05/11/2023  She has had no cough.  She has had no nausea or vomiting.  She is trying to watch what she eats.  She has had no rashes.  She has had no problems with COVID.  There is been no headache.  Overall, I would say performance status is probably ECOG 1.   Wt Readings from Last 3 Encounters:  06/12/23 123 lb 11.2 oz (56.1 kg)  05/06/23 124 lb 9 oz (56.5 kg)  02/13/23 121 lb 4.1 oz (55 kg)    Medications:  Current Outpatient Medications:    acyclovir (ZOVIRAX) 400 MG tablet, Take 400 mg by mouth 2 (two) times daily., Disp: , Rfl:    allopurinol (ZYLOPRIM) 300 MG tablet, Take 1 tablet by mouth daily., Disp: , Rfl:    ICLUSIG 30 MG tablet, Take 30 mg by mouth daily., Disp: , Rfl:    sulfamethoxazole-trimethoprim (BACTRIM DS) 800-160 MG tablet, Take 1 tablet by mouth every Monday, Wednesday, and Friday., Disp: , Rfl:   Allergies: No Known Allergies  Past Medical History, Surgical history, Social history, and Family History were reviewed and updated.  Review of Systems: Review of Systems  Constitutional: Negative.   HENT:  Negative.    Eyes: Negative.   Respiratory: Negative.    Cardiovascular: Negative.   Gastrointestinal: Negative.   Endocrine: Negative.   Genitourinary: Negative.    Musculoskeletal: Negative.   Skin: Negative.    Neurological: Negative.   Hematological: Negative.   Psychiatric/Behavioral: Negative.      Physical Exam:  height is 5\' 5"  (1.651 m) and weight is 123 lb 11.2 oz (56.1 kg). Her oral temperature is 97.6 F (36.4 C). Her blood pressure is 115/69 and her pulse is 80. Her respiration is 18 and oxygen saturation is 100%.   Wt Readings from Last 3 Encounters:  06/12/23 123 lb 11.2 oz (56.1 kg)  05/06/23 124 lb 9 oz (56.5 kg)  02/13/23 121 lb 4.1 oz (55 kg)    Physical Exam Vitals reviewed.  HENT:     Head: Normocephalic and atraumatic.  Eyes:     Pupils: Pupils are equal, round, and reactive to light.  Cardiovascular:     Rate and Rhythm: Normal rate and regular rhythm.     Heart sounds: Normal heart sounds.  Pulmonary:     Effort: Pulmonary effort is normal.     Breath sounds: Normal breath sounds.  Abdominal:     General: Bowel sounds are normal.     Palpations: Abdomen is soft.  Musculoskeletal:        General: No tenderness or deformity. Normal range of motion.     Cervical back: Normal range of motion.  Lymphadenopathy:     Cervical: No cervical adenopathy.  Skin:    General: Skin  is warm and dry.     Findings: No erythema or rash.  Neurological:     Mental Status: She is alert and oriented to person, place, and time.  Psychiatric:        Behavior: Behavior normal.        Thought Content: Thought content normal.        Judgment: Judgment normal.      Lab Results  Component Value Date   WBC 3.1 (L) 06/12/2023   HGB 9.6 (L) 06/12/2023   HCT 30.4 (L) 06/12/2023   MCV 90.7 06/12/2023   PLT 173 06/12/2023     Chemistry      Component Value Date/Time   NA 138 05/21/2023 0820   NA 132 06/26/2016 1110   K 3.8 05/21/2023 0820   K 3.7 06/26/2016 1110   CL 103 05/21/2023 0820   CL 102 06/26/2016 1110   CO2 25 05/21/2023 0820   CO2 29 06/26/2016 1110   BUN 17 05/21/2023 0820   BUN 17 06/26/2016 1110   CREATININE 0.44 05/21/2023 0820   CREATININE 0.2 (L)  06/26/2016 1110      Component Value Date/Time   CALCIUM 9.4 05/21/2023 0820   CALCIUM 9.4 06/26/2016 1110   ALKPHOS 61 05/21/2023 0820   ALKPHOS 88 (H) 06/26/2016 1110   AST 9 (L) 05/21/2023 0820   ALT 13 05/21/2023 0820   ALT 42 06/26/2016 1110   BILITOT 1.0 05/21/2023 0820     Impression and Plan: Victoria [name] is a 52 year-old white female with CML.  She has had a long history of CML- over 12 years. She will continue follow up by Dr. Kerin Salen at [facility].   1 week ago her WBC count was 3.0, Hgb 8.8, platelets were 153. Working well.      Victoria Chestnut, PA-C 9/17/202412:00 PM

## 2023-06-12 NOTE — Patient Instructions (Signed)
PICC Home Care Guide A peripherally inserted central catheter (PICC) is a form of IV access that allows medicines and IV fluids to be quickly put into the blood and spread throughout the body. The PICC is a long, thin, flexible tube (catheter) that is put into a vein in a person's arm or leg. The catheter ends in a large vein just outside the heart called the superior vena cava (SVC). After the PICC is put in, a chest X-ray may be done to make sure that it is in the right place. A PICC may be placed for different reasons, such as: To give medicines and liquid nutrition. To give IV fluids and blood products. To take blood samples often. If there is trouble placing a peripheral intravenous (PIV) catheter. If cared for properly, a PICC can remain in place for many months. Having a PICC can allow you to go home from the hospital sooner and continue treatment at home. Medicines and PICC care can be managed at home by a family member, caregiver, or home health care team. What are the risks? Generally, having a PICC is safe. However, problems may occur, including: A blood clot (thrombus) forming in or at the end of the PICC. A blood clot forming in a vein (deep vein thrombosis) or traveling to the lung (pulmonary embolism). Inflammation of the vein (phlebitis) in which the PICC is placed. Infection at the insertion site or in the blood. Blood infections from central lines, like PICCs, can be serious and often require a hospital stay. PICC malposition, or PICC movement or poor placement. A break or cut in the PICC. Do not use scissors near the PICC. Nerve or tendon irritation or injury during PICC insertion. How to care for your PICC Please follow the specific guidelines provided by your health care provider. Preventing infection You and any caregivers should wash your hands often with soap and water for at least 20 seconds. Wash hands: Before touching the PICC or the infusion device. Before changing a  bandage (dressing). Do not change the dressing unless you have been taught to do so and have shown you are able to change it safely. Flush the PICC as told. Tell your health care provider right away if the PICC is hard to flush or does not flush. Do not use force to flush the PICC. Use clean and germ-free (sterile) supplies only. Keep the supplies in a dry place. Do not reuse needles, syringes, or any other supplies. Reusing supplies can lead to infection. Keep the PICC dressing dry and secure it with tape if the edges stop sticking to your skin. Check your PICC insertion site every day for signs of infection. Check for: Redness, swelling, or pain. Fluid or blood. Warmth. Pus or a bad smell. Preventing other problems Do not use a syringe that is less than 10 mL to flush the PICC. Do not have your blood pressure checked on the arm in which the PICC is placed. Do not ever pull or tug on the PICC. Keep it secured to your arm with tape or a stretch wrap when not in use. Do not take the PICC out yourself. Only a trained health care provider should remove the PICC. Keep pets and children away from your PICC. How to care for your PICC dressing Keep your PICC dressing clean and dry to prevent infection. Do not take baths, swim, or use a hot tub until your health care provider approves. Ask your health care provider if you can take  showers. You may only be allowed to take sponge baths. When you are allowed to shower: Ask your health care provider to teach you how to wrap the PICC. Cover the PICC with clear plastic wrap and tape to keep it dry while showering. Follow instructions from your health care provider about how to take care of your insertion site and dressing. Make sure you: Wash your hands with soap and water for at least 20 seconds before and after you change your dressing. If soap and water are not available, use hand sanitizer. Change your dressing only if taught to do so by your health care  provider. Your PICC dressing needs to be changed if it becomes loose or wet. Leave stitches (sutures), skin glue, or adhesive strips in place. These skin closures may need to stay in place for 2 weeks or longer. If adhesive strip edges start to loosen and curl up, you may trim the loose edges. Do not remove adhesive strips completely unless your health care provider tells you to do that. Follow these instructions at home: Disposal of supplies Throw away any syringes in a disposal container that is meant for sharp items (sharps container). You can buy a sharps container from a pharmacy, or you can make one by using an empty, hard plastic bottle with a lid. Place any used dressings or infusion bags into a plastic bag. Throw that bag in the trash. General instructions  Always carry your PICC identification card or wear a medical alert bracelet. Keep the tube clamped at all times, unless it is being used. Always carry a smooth-edge clamp with you to clamp the PICC if it breaks. Do not use scissors or sharp objects near the tube. You may bend your arm and move it freely. If your PICC is near or at the bend of your elbow, avoid activity with repeated motion at the elbow. Avoid lifting heavy objects as told by your health care provider. Keep all follow-up visits. This is important. You will need to have your PICC dressing changed at least once a week. Contact a health care provider if: You have pain in your arm, ear, face, or teeth. You have a fever or chills. You have redness, swelling, or pain around the insertion site. You have fluid or blood coming from the insertion site. Your insertion site feels warm to the touch. You have pus or a bad smell coming from the insertion site. Your skin feels hard and raised around the insertion site. Your PICC dressing has gotten wet or is coming off and you have not been taught how to change it. Get help right away if: You have problems with your PICC, such as  your PICC: Was tugged or pulled and has partially come out. Do not  push the PICC back in. Cannot be flushed, is hard to flush, or leaks around the insertion site when it is flushed. Makes a flushing sound when it is flushed. Appears to have a hole or tear. Is accidentally pulled all the way out. If this happens, cover the insertion site with a gauze dressing. Do not throw the PICC away. Your health care provider will need to check it to be sure the entire catheter came out. You feel your heart racing or skipping beats, or you have chest pain. You have shortness of breath or trouble breathing. You have swelling, redness, warmth, or pain in the arm in which the PICC is placed. You have a red streak going up your arm that  starts under the PICC dressing. These symptoms may be an emergency. Get help right away. Call 911. Do not wait to see if the symptoms will go away. Do not drive yourself to the hospital. Summary A peripherally inserted central catheter (PICC) is a long, thin, flexible tube (catheter) that is put into a vein in the arm or leg. If cared for properly, a PICC can remain in place for many months. Having a PICC can allow you to go home from the hospital sooner and continue treatment at home. The PICC is inserted using a germ-free (sterile) technique by a specially trained health care provider. Only a trained health care provider should remove it. Do not have your blood pressure checked on the arm in which your PICC is placed. Always keep your PICC identification card with you. This information is not intended to replace advice given to you by your health care provider. Make sure you discuss any questions you have with your health care provider. Document Revised: 03/30/2021 Document Reviewed: 03/30/2021 Elsevier Patient Education  2024 [employer].

## 2023-07-02 ENCOUNTER — Telehealth: Payer: Self-pay

## 2023-07-02 NOTE — Transitions of Care (Post Inpatient/ED Visit) (Signed)
07/02/2023  Name: Victoria [name] MRN: [id number] DOB: May 19, 1971  Today's TOC FU Call Status: Today's TOC FU Call Status:: Unsuccessful Call (1st Attempt) Unsuccessful Call (1st Attempt) Date: 07/02/23  Attempted to reach the patient regarding the most recent Inpatient/ED visit.  Follow Up Plan: Additional outreach attempts will be made to reach the patient to complete the Transitions of Care (Post Inpatient/ED visit) call.   Alyse Low, RN, BA, [facility], CRRN [facility] Coordinator, Transition of Care Ph # [phone]

## 2023-07-05 ENCOUNTER — Telehealth: Payer: Self-pay

## 2023-07-05 NOTE — Transitions of Care (Post Inpatient/ED Visit) (Signed)
07/05/2023  Name: Victoria [name] MRN: [id number] DOB: October 26, 1970  Today's TOC FU Call Status: Today's TOC FU Call Status:: Unsuccessful Call (2nd Attempt) Unsuccessful Call (2nd Attempt) Date: 07/05/23  Attempted to reach the patient regarding the most recent Inpatient/ED visit.  Follow Up Plan: Additional outreach attempts will be made to reach the patient to complete the Transitions of Care (Post Inpatient/ED visit) call.   Alyse Low, RN, BA, [facility], CRRN [facility] Coordinator, Transition of Care Ph # [phone]

## 2023-07-09 ENCOUNTER — Telehealth: Payer: Self-pay

## 2023-07-09 NOTE — Transitions of Care (Post Inpatient/ED Visit) (Signed)
07/09/2023  Name: Victoria [name] MRN: [id number] DOB: 1971-05-17  Today's TOC FU Call Status: Today's TOC FU Call Status:: Unsuccessful Call (3rd Attempt) Unsuccessful Call (3rd Attempt) Date: 07/09/23  Attempted to reach the patient regarding the most recent Inpatient/ED visit.  Follow Up Plan: No further outreach attempts will be made at this time. We have been unable to contact the patient.  Alyse Low, RN, BA, [facility], CRRN [facility] Coordinator, Transition of Care Ph # [phone]

## 2023-08-30 ENCOUNTER — Telehealth: Payer: Self-pay

## 2023-08-30 NOTE — Telephone Encounter (Signed)
Mychart message sent.
